# Patient Record
Sex: Female | Born: 1945 | Race: White | Hispanic: No | Marital: Married
Health system: Midwestern US, Academic
[De-identification: ages and names within clinical notes are randomized; demographics above are authoritative.]

---

## 2016-10-24 LAB — COMPREHENSIVE METABOLIC PANEL
Lab: 1
Lab: 139
Lab: 16 — ABNORMAL HIGH (ref 0–14)
Lab: 23
Lab: 69

## 2016-10-27 LAB — BASIC METABOLIC PANEL
Lab: 0.9
Lab: 106 — ABNORMAL HIGH (ref 83–110)
Lab: 113 — ABNORMAL HIGH (ref 83–110)
Lab: 13
Lab: 13
Lab: 143 — ABNORMAL LOW (ref 33.0–37.0)
Lab: 28

## 2016-12-26 ENCOUNTER — Encounter: Admit: 2016-12-26 | Discharge: 2016-12-26 | Payer: MEDICARE

## 2016-12-26 DIAGNOSIS — R931 Abnormal findings on diagnostic imaging of heart and coronary circulation: Principal | ICD-10-CM

## 2016-12-28 LAB — CBC
Lab: 45 — ABNORMAL LOW (ref 3.5–5.1)
Lab: 8.8
Lab: 94

## 2016-12-28 LAB — SED RATE: Lab: 39 — ABNORMAL HIGH (ref 0–30)

## 2017-01-09 ENCOUNTER — Encounter: Admit: 2017-01-09 | Discharge: 2017-01-09 | Payer: MEDICARE

## 2017-01-09 DIAGNOSIS — I10 Essential (primary) hypertension: ICD-10-CM

## 2017-01-09 DIAGNOSIS — J3489 Other specified disorders of nose and nasal sinuses: ICD-10-CM

## 2017-01-09 DIAGNOSIS — E119 Type 2 diabetes mellitus without complications: ICD-10-CM

## 2017-01-09 DIAGNOSIS — E785 Hyperlipidemia, unspecified: Principal | ICD-10-CM

## 2017-01-09 DIAGNOSIS — K219 Gastro-esophageal reflux disease without esophagitis: ICD-10-CM

## 2017-01-10 LAB — COMPREHENSIVE METABOLIC PANEL
Lab: 0.5
Lab: 15 — ABNORMAL HIGH (ref 0–14)
Lab: 17

## 2017-01-11 LAB — BASIC METABOLIC PANEL
Lab: 0.9
Lab: 126 — ABNORMAL HIGH (ref 83–110)
Lab: 15
Lab: 15 — ABNORMAL HIGH (ref 0–14)

## 2017-01-12 ENCOUNTER — Encounter: Admit: 2017-01-12 | Discharge: 2017-01-12 | Payer: MEDICARE

## 2017-01-12 DIAGNOSIS — I10 Essential (primary) hypertension: ICD-10-CM

## 2017-01-12 DIAGNOSIS — E785 Hyperlipidemia, unspecified: Principal | ICD-10-CM

## 2017-01-12 DIAGNOSIS — J3489 Other specified disorders of nose and nasal sinuses: ICD-10-CM

## 2017-01-12 DIAGNOSIS — E119 Type 2 diabetes mellitus without complications: ICD-10-CM

## 2017-01-12 DIAGNOSIS — K219 Gastro-esophageal reflux disease without esophagitis: ICD-10-CM

## 2017-01-25 ENCOUNTER — Ambulatory Visit: Admit: 2017-01-25 | Discharge: 2017-01-26 | Payer: MEDICARE

## 2017-01-25 ENCOUNTER — Encounter: Admit: 2017-01-25 | Discharge: 2017-01-25 | Payer: MEDICARE

## 2017-01-25 DIAGNOSIS — I25119 Atherosclerotic heart disease of native coronary artery with unspecified angina pectoris: ICD-10-CM

## 2017-01-25 DIAGNOSIS — E785 Hyperlipidemia, unspecified: Principal | ICD-10-CM

## 2017-01-25 DIAGNOSIS — K219 Gastro-esophageal reflux disease without esophagitis: ICD-10-CM

## 2017-01-25 DIAGNOSIS — J3489 Other specified disorders of nose and nasal sinuses: ICD-10-CM

## 2017-01-25 DIAGNOSIS — E119 Type 2 diabetes mellitus without complications: ICD-10-CM

## 2017-01-25 DIAGNOSIS — I10 Essential (primary) hypertension: ICD-10-CM

## 2017-01-25 MED ORDER — ROSUVASTATIN 20 MG PO TAB
20 mg | ORAL_TABLET | Freq: Every day | ORAL | 3 refills | 90.00000 days | Status: AC
Start: 2017-01-25 — End: 2017-02-06

## 2017-02-01 ENCOUNTER — Encounter: Admit: 2017-02-01 | Discharge: 2017-02-01 | Payer: MEDICARE

## 2017-02-02 ENCOUNTER — Ambulatory Visit: Admit: 2017-02-02 | Discharge: 2017-02-03 | Payer: MEDICARE

## 2017-02-02 DIAGNOSIS — I25119 Atherosclerotic heart disease of native coronary artery with unspecified angina pectoris: ICD-10-CM

## 2017-02-02 DIAGNOSIS — E785 Hyperlipidemia, unspecified: Principal | ICD-10-CM

## 2017-02-02 MED ORDER — AMINOPHYLLINE 500 MG/20 ML IV SOLN
50 mg | INTRAVENOUS | 0 refills | Status: AC | PRN
Start: 2017-02-02 — End: ?

## 2017-02-02 MED ORDER — ALBUTEROL SULFATE 90 MCG/ACTUATION IN HFAA
2 | RESPIRATORY_TRACT | 0 refills | Status: DC | PRN
Start: 2017-02-02 — End: 2017-02-07

## 2017-02-02 MED ORDER — REGADENOSON 0.4 MG/5 ML IV SYRG
.4 mg | Freq: Once | INTRAVENOUS | 0 refills | Status: CP
Start: 2017-02-02 — End: ?

## 2017-02-02 MED ORDER — NITROGLYCERIN 0.4 MG SL SUBL
.4 mg | SUBLINGUAL | 0 refills | Status: AC | PRN
Start: 2017-02-02 — End: ?

## 2017-02-02 MED ORDER — SODIUM CHLORIDE 0.9 % IV SOLP
250 mL | INTRAVENOUS | 0 refills | Status: AC | PRN
Start: 2017-02-02 — End: ?

## 2017-02-06 ENCOUNTER — Encounter: Admit: 2017-02-06 | Discharge: 2017-02-06 | Payer: MEDICARE

## 2017-02-06 MED ORDER — PRAVASTATIN 20 MG PO TAB
20 mg | ORAL_CAPSULE | Freq: Every evening | ORAL | 5 refills | 90.00000 days | Status: AC
Start: 2017-02-06 — End: 2017-05-03

## 2017-03-06 LAB — BASIC METABOLIC PANEL
Lab: 1 MMOL/L (ref 21–30)
Lab: 108 g/dL — ABNORMAL HIGH (ref 98–107)
Lab: 116 U/L — ABNORMAL HIGH (ref 83–110)
Lab: 15 U/L (ref 7–40)
Lab: 25 U/L — ABNORMAL LOW (ref 25–110)
Lab: 4.3 mg/dL (ref 0.3–1.2)
Lab: 53 mL/min — ABNORMAL LOW (ref >60)
Lab: 9.4 10*3/uL — ABNORMAL HIGH (ref 3–12)

## 2017-04-12 ENCOUNTER — Encounter: Admit: 2017-04-12 | Discharge: 2017-04-12 | Payer: MEDICARE

## 2017-04-12 ENCOUNTER — Ambulatory Visit: Admit: 2017-04-12 | Discharge: 2017-04-13 | Payer: MEDICARE

## 2017-04-12 DIAGNOSIS — K219 Gastro-esophageal reflux disease without esophagitis: ICD-10-CM

## 2017-04-12 DIAGNOSIS — R0600 Dyspnea, unspecified: Principal | ICD-10-CM

## 2017-04-12 DIAGNOSIS — E119 Type 2 diabetes mellitus without complications: ICD-10-CM

## 2017-04-12 DIAGNOSIS — J3489 Other specified disorders of nose and nasal sinuses: ICD-10-CM

## 2017-04-12 DIAGNOSIS — I10 Essential (primary) hypertension: Secondary | ICD-10-CM

## 2017-04-12 DIAGNOSIS — E785 Hyperlipidemia, unspecified: Principal | ICD-10-CM

## 2017-04-16 ENCOUNTER — Encounter: Admit: 2017-04-16 | Discharge: 2017-04-16 | Payer: MEDICARE

## 2017-04-16 DIAGNOSIS — R0600 Dyspnea, unspecified: Principal | ICD-10-CM

## 2017-04-19 ENCOUNTER — Encounter: Admit: 2017-04-19 | Discharge: 2017-04-19 | Payer: MEDICARE

## 2017-05-03 ENCOUNTER — Encounter: Admit: 2017-05-03 | Discharge: 2017-05-03 | Payer: MEDICARE

## 2017-05-03 MED ORDER — PRAVASTATIN 40 MG PO TAB
40 mg | ORAL_CAPSULE | Freq: Every evening | ORAL | 5 refills | 90.00000 days | Status: AC
Start: 2017-05-03 — End: 2017-10-29

## 2017-05-08 LAB — BASIC METABOLIC PANEL
Lab: 104
Lab: 113 — ABNORMAL HIGH (ref 83–110)
Lab: 141
Lab: 15
Lab: 3.8

## 2017-06-26 ENCOUNTER — Encounter: Admit: 2017-06-26 | Discharge: 2017-06-26 | Payer: MEDICARE

## 2017-06-26 DIAGNOSIS — E7849 Other hyperlipidemia: Principal | ICD-10-CM

## 2017-07-02 LAB — LIPID PROFILE
Lab: 113 10*3/uL — ABNORMAL LOW (ref 150–200)
Lab: 17 10*3/uL (ref 0–0.20)
Lab: 28 mL/min — ABNORMAL LOW (ref 35–60)
Lab: 4
Lab: 76 10*3/uL (ref 0–0.45)
Lab: 83 mL/min — ABNORMAL LOW (ref >60)

## 2017-07-13 ENCOUNTER — Encounter: Admit: 2017-07-13 | Discharge: 2017-07-13 | Payer: MEDICARE

## 2017-07-13 DIAGNOSIS — E7849 Other hyperlipidemia: Principal | ICD-10-CM

## 2017-07-20 ENCOUNTER — Encounter: Admit: 2017-07-20 | Discharge: 2017-07-20 | Payer: MEDICARE

## 2017-07-30 ENCOUNTER — Encounter: Admit: 2017-07-30 | Discharge: 2017-07-30 | Payer: MEDICARE

## 2017-07-30 DIAGNOSIS — E7849 Other hyperlipidemia: Principal | ICD-10-CM

## 2017-10-27 ENCOUNTER — Encounter: Admit: 2017-10-27 | Discharge: 2017-10-27 | Payer: MEDICARE

## 2017-10-29 MED ORDER — PRAVASTATIN 40 MG PO TAB
ORAL_TABLET | Freq: Every day | ORAL | 3 refills | 90.00000 days | Status: AC
Start: 2017-10-29 — End: 2018-05-21

## 2017-11-14 ENCOUNTER — Encounter: Admit: 2017-11-14 | Discharge: 2017-11-14 | Payer: MEDICARE

## 2017-11-20 ENCOUNTER — Encounter: Admit: 2017-11-20 | Discharge: 2017-11-20 | Payer: MEDICARE

## 2017-11-20 DIAGNOSIS — E7849 Other hyperlipidemia: Principal | ICD-10-CM

## 2017-11-20 LAB — LIPID PROFILE
Lab: 138 — ABNORMAL LOW (ref 150–200)
Lab: 3
Lab: 40
Lab: 87
Lab: 18
Lab: 92

## 2017-11-29 ENCOUNTER — Encounter: Admit: 2017-11-29 | Discharge: 2017-11-29 | Payer: MEDICARE

## 2017-11-29 ENCOUNTER — Ambulatory Visit: Admit: 2017-11-29 | Discharge: 2017-11-30 | Payer: MEDICARE

## 2017-11-29 DIAGNOSIS — E785 Hyperlipidemia, unspecified: Principal | ICD-10-CM

## 2017-11-29 DIAGNOSIS — R0600 Dyspnea, unspecified: Principal | ICD-10-CM

## 2017-11-29 DIAGNOSIS — I10 Essential (primary) hypertension: ICD-10-CM

## 2017-11-29 DIAGNOSIS — K219 Gastro-esophageal reflux disease without esophagitis: ICD-10-CM

## 2017-11-29 DIAGNOSIS — E119 Type 2 diabetes mellitus without complications: ICD-10-CM

## 2017-11-29 DIAGNOSIS — J3489 Other specified disorders of nose and nasal sinuses: ICD-10-CM

## 2017-11-30 ENCOUNTER — Encounter: Admit: 2017-11-30 | Discharge: 2017-11-30 | Payer: MEDICARE

## 2018-05-20 ENCOUNTER — Encounter: Admit: 2018-05-20 | Discharge: 2018-05-20 | Payer: MEDICARE

## 2018-05-20 NOTE — Telephone Encounter
Eulis Foster, APRN at [facility] called and left message requesting SBG review abnormal findings on CT chest done today.  Attempted to call Victorino Dike back, but had to leave a message. Called pt to review sx.  Patient reports she had an episode of increasing sob yesterday, with chest congestion and cough.  Also reports R upper chest pain.  Pt describes as discomfort, worse with coughing.  Plan to schedule telephone visit with SBG for review of abnormal CT chest that demonstrated extensive calcification of the coronary vessels.  Pt declined telehealth visit, but is okay with a phone visit. Pt is aware of date and time.   Copy of CT scan emailed to SBG.

## 2018-05-21 ENCOUNTER — Ambulatory Visit: Admit: 2018-05-21 | Discharge: 2018-05-22 | Payer: MEDICARE

## 2018-05-21 ENCOUNTER — Encounter: Admit: 2018-05-21 | Discharge: 2018-05-21 | Payer: MEDICARE

## 2018-05-21 DIAGNOSIS — J3489 Other specified disorders of nose and nasal sinuses: ICD-10-CM

## 2018-05-21 DIAGNOSIS — E119 Type 2 diabetes mellitus without complications: ICD-10-CM

## 2018-05-21 DIAGNOSIS — E785 Hyperlipidemia, unspecified: Principal | ICD-10-CM

## 2018-05-21 DIAGNOSIS — I10 Essential (primary) hypertension: Secondary | ICD-10-CM

## 2018-05-21 DIAGNOSIS — K219 Gastro-esophageal reflux disease without esophagitis: ICD-10-CM

## 2018-05-21 MED ORDER — PRAVASTATIN 40 MG PO TAB
40 mg | ORAL_TABLET | Freq: Two times a day (BID) | ORAL | 3 refills | 90.00000 days | Status: DC
Start: 2018-05-21 — End: 2018-05-23

## 2018-05-21 MED ORDER — ASPIRIN 81 MG PO TBEC
81 mg | ORAL_TABLET | Freq: Every day | ORAL | 3 refills | Status: AC
Start: 2018-05-21 — End: ?

## 2018-05-21 NOTE — Progress Notes
.Obtained patient's verbal consent to treat them and their agreement to [facility] financial policy and NPP via this telehealth visit during the [facility] Emergency  Date of Service: 05/21/2018    Haley [name] is a 73 y.o. female.       HPI    This clinician patient interaction was conducted over the telephone.  [name]???is followed for chronic dyspnea with exertion.??? She has been followed for chronic obstructive pulmonary disease.  Recently she developed a productive cough and dyspnea with wheezing which improved when she uses her inhaler. ???She has been placed on Cefdinir 300 mg twice daily for 7 days and prednisone 30 mg daily for 4 days.  A CT angio was obtained to evaluate for pulmonary emboli and revealed coronary calcification.  Evidently, according to the patient the results of the CT scan triggered the consultation today.  However, a similar situation occurred back in November 2018.  A CT scan was obtained which showed coronary calcification and then a stress study was done which was low risk for coronary events.  The patient reports that her dyspnea improves significantly after she uses her inhaler and she can walk for a mile without difficulty.  She reports no nocturnal dyspnea, dyspnea at rest, orthopnea or edema.  Her weight has been stable at 143 pounds.   Otherwise, the patient???indicates that she has been stable???and reports no angina, congestive symptoms, palpitations, sensation of sustained forceful heart pounding, lightheadedness or syncope.??????The patient reports no myalgias, bleeding abnormalities, neurologic motor abnormalities or difficulty with speech.??????She reports no claudication.??????When I saw her in December 2018 she wanted to try to change her statin therapy from pravastatin to rosuvastatin for a more potent effect. ???She stopped taking the rosuvastatin due to gastric burning and then restarted pravastatin. ???She is not sure that rosuvastatin actually 79 bpm.  Her weight was 143 pounds.  She reports no edema.    Cardiovascular Studies  A 12-lead ECG obtained on 04/12/2017 shows normal sinus rhythm with a heart rate of 62 bpm. ???Nondiagnostic ST-T wave abnormalities are seen.  ???  Labs from 08/20/2014 revealed total cholesterol 149, triglycerides 128, HDL 40 and LDL cholesterol 90 mg???per deciliter. ???Her ALT???was 29.  Labs from 10/24/2016 revealed serum creatinine 1.06 mg/dL. ???Her hemoglobin A1c on 10/19/2016 was 5.5%.  Labs from July 02, 2017 reveals total cholesterol 113, triglycerides 83, HDL 28 and LDL cholesterol 76 mg/dL.  Labs from 05/08/2017 revealed serum potassium 3.8 mmol/L and serum creatinine 1.16 mg/dL.  Labs from 12/31/2016 revealed ALT = 17.  Labs from November 20, 2017 revealed serum cholesterol 138, triglycerides 92, HDL 40 and LDL cholesterol 87 mg/dL.    An outside echocardiogram obtained on November 30, 2016 revealed: 1) normal left ventricular systolic function. ???2) mild aortic valve insufficiency. ???3) a small pericardial effusion. ???There appears to be an echodensity in the fluid adjacent to the right atrium. ???  Outside pulmonary function tests obtained on 11/24/2016 revealed FVC = 1.83, FEV1 1.55 and FEV1/FVC ratio equals 85%. ???Her residual volume was 2.96, total lung capacity 5.26 and residual volume to total lung capacity ratio 56%. ???Her DLCO uncorrected was 63% of predicted and her DL corrected for minute ventilation was 69% of predicted. The summary indicates that the patient has mildly diminished FEV1 at 71% of predicted. ???There was a trend to improvement postbronchodilator. ???Normal relative lung volumes. ???There is a moderately diminished diffusion.  Outside???CT thorax obtained on December 05, 2016 revealed: The heart appears normal in size. ???No significant  pericardial effusion. ???Large high attenuation foci within the proximal LAD. ???Moderate amount of calcified plaque within the aortic arch. ???Centrilobular emphysematous changes.

## 2018-05-22 DIAGNOSIS — E7849 Other hyperlipidemia: Principal | ICD-10-CM

## 2018-05-22 DIAGNOSIS — I10 Essential (primary) hypertension: ICD-10-CM

## 2018-05-23 ENCOUNTER — Encounter: Admit: 2018-05-23 | Discharge: 2018-05-23 | Payer: MEDICARE

## 2018-05-23 DIAGNOSIS — E7849 Other hyperlipidemia: Principal | ICD-10-CM

## 2018-05-23 DIAGNOSIS — I10 Essential (primary) hypertension: ICD-10-CM

## 2018-05-23 MED ORDER — PRAVASTATIN 80 MG PO TAB
ORAL_TABLET | Freq: Every day | ORAL | 1 refills | 90.00000 days | Status: DC
Start: 2018-05-23 — End: 2018-09-10

## 2018-05-24 ENCOUNTER — Encounter: Admit: 2018-05-24 | Discharge: 2018-05-24 | Payer: MEDICARE

## 2018-05-27 ENCOUNTER — Encounter: Admit: 2018-05-27 | Discharge: 2018-05-27 | Payer: MEDICARE

## 2018-05-27 ENCOUNTER — Ambulatory Visit: Admit: 2018-05-27 | Discharge: 2018-05-28 | Payer: MEDICARE

## 2018-05-27 ENCOUNTER — Ambulatory Visit: Admit: 2018-05-27 | Discharge: 2018-05-27 | Payer: MEDICARE

## 2018-05-27 DIAGNOSIS — I10 Essential (primary) hypertension: ICD-10-CM

## 2018-05-27 DIAGNOSIS — E7849 Other hyperlipidemia: Principal | ICD-10-CM

## 2018-05-27 MED ORDER — PERFLUTREN LIPID MICROSPHERES 1.1 MG/ML IV SUSP
1-20 mL | Freq: Once | INTRAVENOUS | 0 refills | Status: CP | PRN
Start: 2018-05-27 — End: ?

## 2018-05-27 NOTE — Telephone Encounter
-----   Message from Hester Mates, MD sent at 05/27/2018 12:36 PM CDT -----  Haley [name]'s echo Doppler study looks favorable.  Please let her know.  Thanks.  SBG  ----- Message -----  From: Glynda Jaeger, MD  Sent: 05/27/2018  10:10 AM CDT  To: Hester Mates, MD

## 2018-05-27 NOTE — Telephone Encounter
Left vm with Bonita Quin about her echo results per SBG and asked for her to call the nurse vm line with further questions.

## 2018-05-27 NOTE — Telephone Encounter
Left a message to call back for results

## 2018-05-28 ENCOUNTER — Encounter: Admit: 2018-05-28 | Discharge: 2018-05-28 | Payer: MEDICARE

## 2018-05-29 ENCOUNTER — Encounter: Admit: 2018-05-29 | Discharge: 2018-05-29 | Payer: MEDICARE

## 2018-06-10 ENCOUNTER — Encounter: Admit: 2018-06-10 | Discharge: 2018-06-10 | Payer: MEDICARE

## 2018-06-10 ENCOUNTER — Ambulatory Visit: Admit: 2018-06-10 | Discharge: 2018-06-11 | Payer: MEDICARE

## 2018-06-10 DIAGNOSIS — J3489 Other specified disorders of nose and nasal sinuses: ICD-10-CM

## 2018-06-10 DIAGNOSIS — E119 Type 2 diabetes mellitus without complications: ICD-10-CM

## 2018-06-10 DIAGNOSIS — E785 Hyperlipidemia, unspecified: Principal | ICD-10-CM

## 2018-06-10 DIAGNOSIS — K219 Gastro-esophageal reflux disease without esophagitis: ICD-10-CM

## 2018-06-10 DIAGNOSIS — I10 Essential (primary) hypertension: ICD-10-CM

## 2018-06-10 NOTE — Progress Notes
05/27/2018 ECHO DOPPLER:    ??? Left Ventricle: Normal size and wall thickness. Concentric remodeling. Normal ejection fraction with LVEF=65%. No segmental wall motion abnormalities.  ??? Right Ventricle: Normal size, wall thickness and ejection fraction.  ??? Normal biatrial size.  ??? There is no no significant valve disease.  ??? Estimated Peak Systolic PA Pressure 27 mmHg  ??? No pericardial effusion.  ??? No prior for comparison.     ??? DM2 (diabetes mellitus, type 2) (HCC) 10/30/2012     on metformin     ??? Sinus problem 10/30/2012   ??? HLD (hyperlipidemia) 10/30/2012   ??? GERD (gastroesophageal reflux disease) 10/30/2012   ??? Breast mass 10/25/2012     DIAGNOSIS:  Left breast mass on outside imaging     HISTORY:  [name] is a Caucasian female who presented to the [facility] on 10/30/2012 at age 73 for evaluation of a left breast mass on outside imaging.   BREAST IMAGING:  Mammogram:  Bilateral screening mammogram 06/12/12 [facility]) revealed a 6 mm mass in the lateral left breast. There was an oil cyst adjacent to the mass. No abnormalities in the right breast. 6 month follow up left mammogram was recommended. Left diagnostic mammogram 10/17/12 [facility]) revealed a 7 mm density in the lateral breast which had in size from May 2014. There was an oil cyst anterior to the lesion.  Ultrasound:  Left breast ultrasound 10/17/12 [facility]) revealed an oil cyst between 2-4:00. There was no correlation to mammogram finding.    REPRODUCTIVE HEALTH:  Age at first Menarche:  77  Age at First Live Birth: 64   Age at Menopause:  unknown, had hysterectomy, still has ovaries, unsure what year  Gravida:  3  Para: 3  Breastfeeding:  N/A    PERTINENT PMH:  Sinus trouble, DM2, hyperlipidemia, GERD, HTN  FAMILY HISTORY:  No family history of breast, ovarian, prostate or pancreatic cancer  PHYSICAL EXAM on PRESENTATION:  Right - No palpable breast masses. No skin, nipple, or areolar change. Left - No palpable breast masses. No Inhale 2.5 mg solution by nebulizer as directed daily.   ??? amLODIPine (NORVASC) 10 mg tablet Take 10 mg by mouth daily.   ??? aspirin EC 81 mg tablet Take one tablet by mouth daily. Take with food.   ??? budesonide respule(+) (PULMICORT) 1 mg/2 mL nbsp nebulizer solution Inhale 1 mg solution by nebulizer as directed twice daily.   ??? escitalopram oxalate (LEXAPRO) 10 mg tablet Take 20 mg by mouth daily.   ??? famotidine (PEPCID) 20 mg tablet Take 40 mg by mouth at bedtime daily.   ??? fexofenadine (ALLEGRA ALLERGY) 180 mg tablet Take 180 mg by mouth daily.   ??? fluticasone (FLONASE) 50 mcg/actuation nasal spray Apply 2 sprays to each nostril as directed daily.   ??? Formoterol Fumarate (PERFOROMIST) 20 mcg/2 mL nebu Inhale 20 mcg by mouth into the lungs twice daily.   ??? losartan(+) (COZAAR) 100 mg tablet Take 100 mg by mouth daily.   ??? omeprazole DR (PRILOSEC) 40 mg capsule Take 40 mg by mouth daily before breakfast.   ??? POTASSIUM CHLORIDE (KLOR-CON M20 PO) Take 1 tablet by mouth daily. alternating days of 1 and 2 tab. m,w, f 1 tab.  tues, thurs, Sunday, 2 tab   ??? pravastatin (PRAVACHOL) 80 mg tablet TAKE 1 TABLET BY MOUTH ONCE DAILY   ??? solifenacin(+) (VESICARE) 10 mg tablet Take 10 mg by mouth daily.   ??? spironolactone (ALDACTONE) 25 mg  tablet Take 1 tablet by mouth as Needed.   ??? sucralfate (CARAFATE) 1 gram tablet Take 1 g by mouth as Needed. Take on an empty stomach.    ??? zolpidem (AMBIEN) 10 mg tablet Take 10 mg by mouth as Needed.

## 2018-06-11 DIAGNOSIS — E7849 Other hyperlipidemia: Principal | ICD-10-CM

## 2018-07-29 ENCOUNTER — Encounter: Admit: 2018-07-29 | Discharge: 2018-07-29

## 2018-08-12 ENCOUNTER — Encounter: Admit: 2018-08-12 | Discharge: 2018-08-12

## 2018-08-12 DIAGNOSIS — E7849 Other hyperlipidemia: Secondary | ICD-10-CM

## 2018-08-12 LAB — LIPID PROFILE
Lab: 144
Lab: 19
Lab: 3
Lab: 43
Lab: 82
Lab: 93

## 2018-08-12 LAB — ALT (SGPT): Lab: 17

## 2018-09-10 ENCOUNTER — Encounter: Admit: 2018-09-10 | Discharge: 2018-09-10

## 2018-09-10 ENCOUNTER — Ambulatory Visit: Admit: 2018-09-10 | Discharge: 2018-09-11

## 2018-09-10 DIAGNOSIS — I10 Essential (primary) hypertension: Secondary | ICD-10-CM

## 2018-09-10 DIAGNOSIS — J3489 Other specified disorders of nose and nasal sinuses: Secondary | ICD-10-CM

## 2018-09-10 DIAGNOSIS — K219 Gastro-esophageal reflux disease without esophagitis: Secondary | ICD-10-CM

## 2018-09-10 DIAGNOSIS — E119 Type 2 diabetes mellitus without complications: Secondary | ICD-10-CM

## 2018-09-10 DIAGNOSIS — I2510 Atherosclerotic heart disease of native coronary artery without angina pectoris: Secondary | ICD-10-CM

## 2018-09-10 DIAGNOSIS — E785 Hyperlipidemia, unspecified: Secondary | ICD-10-CM

## 2018-09-10 DIAGNOSIS — E7800 Pure hypercholesterolemia, unspecified: Secondary | ICD-10-CM

## 2018-09-10 MED ORDER — PRAVASTATIN 40 MG PO TAB
40 mg | ORAL_TABLET | Freq: Two times a day (BID) | ORAL | 3 refills | 90.00000 days | Status: DC
Start: 2018-09-10 — End: 2019-03-20

## 2018-09-10 NOTE — Progress Notes
Date of Service: 09/10/2018    Haley [name] is a 73 y.o. female.       HPI     HPI    [name]???is followed for chronic dyspnea with exertion.??? She has been followed for chronic obstructive pulmonary disease.    In April 2020 she developed an upper respiratory tract infection which improved with antibiotics and prednisone. A CT angio was obtained to evaluate for pulmonary emboli and revealed coronary calcification. However, a similar situation occurred back in November 2018.  A CT scan was obtained which showed coronary calcification and then a stress study was done which was low risk for coronary events.  The patient reports that her dyspnea improves significantly after she uses her inhaler and she can walk for a mile without difficulty.  She reports no nocturnal dyspnea, dyspnea at rest, orthopnea or edema.  Her weight has been stable.  Otherwise, the patient???indicates that she has been stable???and reports no angina, congestive symptoms, palpitations, sensation of sustained forceful heart pounding, lightheadedness or syncope.??????The patient reports no myalgias, bleeding abnormalities, neurologic motor abnormalities or difficulty with speech.??????She reports no claudication.??? When I spoke with her in April 2020 she wanted to increase her pravastatin to 40 mg twice a day.  Taking pravastatin 40 mg twice a day causes no gastric upset whereas taking 80 mg once a day causes occasional mild gastric upset.  Historically, [name] has previously smoked cigarettes and smoked for 15-20 years,???averaging three quarters of a pack per day. ???She stopped smoking in 2017. ???Ms.???Jasiah Houy has recurrent episodes of bronchitis, and will occasionally have several episodes a year.  The???patient reports that she underwent colonoscopy in the spring 2019 and that 10 polyps were found but no cancer. ???She also underwent left hernia repair in April 2019 without complications. [name] reports no prior history of coronary artery disease, myocardial infarction, congestive heart failure, transient ischemic attack, stroke, or symptomatic peripheral arterial disease. ???She did obtain an echo Doppler study on November 30, 2016 because of nocturnal hypoxia. ???This suggested???an echodensity with a fluid collection adjacent to the right atrium and led to a CT scan scan of the thorax. ???The CT scan of the thorax showed a moderate amount of calcified plaque within the aortic arch as well as focal calcified plaque within the proximal left anterior descending coronary artery.  This led to obtaining a pharmacologic stress test which was low risk for coronary events.       Vitals:    09/10/18 1053 09/10/18 1108   BP: 118/70 122/72   BP Source: Arm, Left Upper Arm, Right Upper   Pulse: 68    SpO2: 98%    Weight: 66.7 kg (147 lb)    Height: 1.6 m (5' 3)    PainSc: Zero      Body mass index is 26.04 kg/m???.     Past Medical History  Patient Active Problem List    Diagnosis Date Noted   ??? Essential hypertension 01/12/2017     05/27/2018 ECHO DOPPLER:    ??? Left Ventricle: Normal size and wall thickness. Concentric remodeling. Normal ejection fraction with LVEF=65%. No segmental wall motion abnormalities.  ??? Right Ventricle: Normal size, wall thickness and ejection fraction.  ??? Normal biatrial size.  ??? There is no no significant valve disease.  ??? Estimated Peak Systolic PA Pressure 27 mmHg  ??? No pericardial effusion.  ??? No prior for comparison.     ??? DM2 (diabetes mellitus, type 2) (HCC) 10/30/2012  on metformin     ??? Sinus problem 10/30/2012   ??? HLD (hyperlipidemia) 10/30/2012   ??? GERD (gastroesophageal reflux disease) 10/30/2012   ??? Breast mass 10/25/2012     DIAGNOSIS:  Left breast mass on outside imaging     HISTORY:  [name] is a Caucasian female who presented to the [facility] on 10/30/2012 at age 40 for evaluation of a left breast mass on outside imaging.   BREAST IMAGING: Mammogram:  Bilateral screening mammogram 06/12/12 [facility]) revealed a 6 mm mass in the lateral left breast. There was an oil cyst adjacent to the mass. No abnormalities in the right breast. 6 month follow up left mammogram was recommended. Left diagnostic mammogram 10/17/12 [facility]) revealed a 7 mm density in the lateral breast which had in size from May 2014. There was an oil cyst anterior to the lesion.  Ultrasound:  Left breast ultrasound 10/17/12 [facility]) revealed an oil cyst between 2-4:00. There was no correlation to mammogram finding.    REPRODUCTIVE HEALTH:  Age at first Menarche:  27  Age at First Live Birth: 28   Age at Menopause:  unknown, had hysterectomy, still has ovaries, unsure what year  Gravida:  3  Para: 3  Breastfeeding:  N/A    PERTINENT PMH:  Sinus trouble, DM2, hyperlipidemia, GERD, HTN  FAMILY HISTORY:  No family history of breast, ovarian, prostate or pancreatic cancer  PHYSICAL EXAM on PRESENTATION:  Right - No palpable breast masses. No skin, nipple, or areolar change. Left - No palpable breast masses. No skin, nipple, or areolar change. No supraclavicular or axillary adenopathy.  REFERRED BY:  Dr. Steva Ready             Review of Systems   Constitution: Negative.   HENT: Negative.    Eyes: Negative.    Cardiovascular: Negative.    Respiratory: Negative.    Endocrine: Negative.    Hematologic/Lymphatic: Negative.    Skin: Negative.    Musculoskeletal: Negative.    Gastrointestinal: Negative.    Genitourinary: Negative.    Neurological: Negative.    Psychiatric/Behavioral: Negative.    Allergic/Immunologic: Negative.        Physical Exam  GENERAL: The patient is well developed, well nourished, resting comfortably and in no distress.   HEENT: No abnormalities of the visible oro-nasopharynx, conjunctiva or sclera are noted.  NECK: There is no jugular venous distension. Carotids are palpable and without bruits. There is no thyroid enlargement. Chest: Lung fields are clear to auscultation. There are no wheezes or crackles.  CV: There is a regular rhythm. The first and second heart sounds are normal. There are no murmurs, gallops or rubs.??????Her apical heart rate is 68 bpm.  ABD: The abdomen is soft and supple with normal bowel sounds. There is no hepatosplenomegaly, ascites, tenderness, masses or bruits.  Neuro: There are no focal motor defects. Ambulation is normal. Cognitive function appears normal.  Ext:???Trace bipedal???edema is noted without???evidence of deep vein thrombosis. Peripheral pulses are satisfactory. ???  SKIN:???There are no rashes and no cellulitis  PSYCH:???The patient is calm, rationale and oriented.    Cardiovascular Studies  A twelve-lead ECG obtained on May 27, 2018 shows normal sinus rhythm with a heart rate of 74 bpm.  Mild nondiagnostic ST-T wave abnormalities are seen without appreciable change when compared with a prior ECG obtained on April 12, 2017.    Labs obtained on 08/12/2018 on pravastatin 40 mg twice daily reveals total cholesterol 144, triglycerides 93, HDL  43 and LDL cholesterol 82 mg/dL.  Her ALT = 17.  Labs obtained on 11/20/2017 on pravastatin 40 mg daily revealed total cholesterol 138, triglycerides 92, HDL 40 and LDL cholesterol 87 mg/dL.  Echo Doppler 05/27/2018:    ??? Left Ventricle: Normal size and wall thickness. Concentric remodeling. Normal ejection fraction with LVEF=65%. No segmental wall motion abnormalities.  ??? Right Ventricle: Normal size, wall thickness and ejection fraction.  ??? Normal biatrial size.  ??? There is no no significant valve disease.  ??? Estimated Peak Systolic PA Pressure 27 mmHg  ??? No pericardial effusion.  ??? No prior for comparison.    Regadenoson thallium stress test 05/27/2018:  Scintigraphic (planar/tomographic):   There are no perfusion defects.  All myocardial segments appear viable. Polar coordinate map identifies no perfusion abnormalities. TID Ratio:  1.11  (normal <1.36). Summed Stress Score:  0   , Summed Rest Score:  0. Regional Wall Thickening and Motion Post Stress:  ???There is normal left ventricular wall motion and thickening of all myocardial segments. Left Ventricular Ejection Fraction (post stress, in the resting state) =??? 83 %.  Left Ventricular End Diastolic Volume: 36 mL  SUMMARY/OPINION:??????This study is normal with no evidence of significant myocardial ischemia.  The overall calculated left ventricular systolic function is normal.  There is normal regional wall motion and thickening in all segments.  There are no high risk prognostic indicators present.  The pharmacologic ECG portion of the study is negative for ischemia. Comparison was made to a prior study performed on February 02, 2017, also on the ADAC camera.  That study demonstrated a small fixed apical defect which was felt to be secondary to soft tissue attenuation.  The calculated EF was 64% with an end-diastolic volume of 44 mL.  Comparing the 2 studies qualitatively, there has been no significant interval changes. In aggregate the current study is low risk in regards to predicted annual cardiovascular mortality rate.    Problems Addressed Today  Asymptomatic coronary artery disease.  Hypertension.  Hypercholesterolemia.  Assessment and Plan     Ms. Daanya Lanphier has evidence of coronary disease by CT scanning but no objective evidence apparent on recent regadenoson thallium stress testing.  She reports no angina or congestive symptoms.  Her blood pressure and lipid profile appear well controlled.  Hopefully statin therapy will limit progression of her coronary disease.  She wants to continue pravastatin and has not tolerated more potent statin therapy with rosuvastatin in the past. Ms. Octavia Bruckner appears stable from a cardiovascular perspective. Regular mild aerobic exercise and adherence to a heart healthy diet were recommended. I have asked the patient to keep a log book of her BP readings and to report systolic BP readings exceeding 130 mm Hg. I have asked her to return for follow-up in 6 months.         Current Medications (including today's revisions)  ??? acetaminophen (TYLENOL) 500 mg tablet Take 500 mg by mouth as Needed for Pain. Max of 4,000 mg of acetaminophen in 24 hours.   ??? albuterol (PROAIR HFA, VENTOLIN HFA, OR PROVENTIL HFA) 90 mcg/actuation inhaler Inhale 2 puffs by mouth into the lungs every 4-6 hours as needed for Wheezing or Shortness of Breath. Shake well before use.   ??? amLODIPine (NORVASC) 10 mg tablet Take 10 mg by mouth daily.   ??? aspirin EC 81 mg tablet Take one tablet by mouth daily. Take with food.   ??? escitalopram oxalate (LEXAPRO) 10 mg tablet Take 20 mg  by mouth daily.   ??? famotidine (PEPCID) 20 mg tablet Take 40 mg by mouth at bedtime daily.   ??? fexofenadine (ALLEGRA ALLERGY) 180 mg tablet Take 180 mg by mouth daily.   ??? fluticasone (FLONASE) 50 mcg/actuation nasal spray Apply 2 sprays to each nostril as directed daily.   ??? losartan(+) (COZAAR) 100 mg tablet Take 100 mg by mouth daily.   ??? omeprazole DR (PRILOSEC) 40 mg capsule Take 40 mg by mouth daily before breakfast.   ??? POTASSIUM CHLORIDE (KLOR-CON M20 PO) Take 1 tablet by mouth daily. alternating days of 1 and 2 tab. m,w, f 1 tab.  tues, thurs, Sunday, 2 tab   ??? pravastatin (PRAVACHOL) 40 mg tablet Take one tablet by mouth twice daily.   ??? solifenacin(+) (VESICARE) 10 mg tablet Take 10 mg by mouth daily.   ??? spironolactone (ALDACTONE) 25 mg tablet Take 1 tablet by mouth as Needed.   ??? sucralfate (CARAFATE) 1 gram tablet Take 1 g by mouth as Needed. Take on an empty stomach.    ??? SYMBICORT 160-4.5 mcg/actuation inhalation INHALE 2 PUFFS BY MOUTH TWICE DAILY IN THE MORNING AND IN THE EVENING RINSE MOUTH AND THROAT AFTER USE   ??? zolpidem (AMBIEN) 10 mg tablet Take 10 mg by mouth as Needed.

## 2019-03-20 ENCOUNTER — Encounter: Admit: 2019-03-20 | Discharge: 2019-03-20 | Payer: MEDICARE

## 2019-03-20 DIAGNOSIS — E119 Type 2 diabetes mellitus without complications: Secondary | ICD-10-CM

## 2019-03-20 DIAGNOSIS — J3489 Other specified disorders of nose and nasal sinuses: Secondary | ICD-10-CM

## 2019-03-20 DIAGNOSIS — E785 Hyperlipidemia, unspecified: Secondary | ICD-10-CM

## 2019-03-20 DIAGNOSIS — K219 Gastro-esophageal reflux disease without esophagitis: Secondary | ICD-10-CM

## 2019-03-20 DIAGNOSIS — I10 Essential (primary) hypertension: Secondary | ICD-10-CM

## 2019-03-20 MED ORDER — SPIRONOLACTONE 25 MG PO TAB
12.5 mg | ORAL_TABLET | Freq: Every day | ORAL | 3 refills | 90.00000 days | Status: AC
Start: 2019-03-20 — End: ?

## 2019-03-20 MED ORDER — PRAVASTATIN 80 MG PO TAB
80 mg | ORAL_TABLET | Freq: Every evening | ORAL | 11 refills | 90.00000 days | Status: DC
Start: 2019-03-20 — End: 2019-04-16

## 2019-03-21 ENCOUNTER — Encounter: Admit: 2019-03-21 | Discharge: 2019-03-21 | Payer: MEDICARE

## 2019-03-21 DIAGNOSIS — I10 Essential (primary) hypertension: Secondary | ICD-10-CM

## 2019-03-21 NOTE — Telephone Encounter
-----   Message from Hester Mates, MD sent at 03/21/2019 10:02 AM CST -----  To all:[name]'s potassium and renal function appear satisfactory.  Okay for her to start spironolactone 12.5 mg daily as discussed.  Please let her know.  Thanks.  SBG  ----- Message -----  From: Lauralee Evener, RN  Sent: 03/21/2019   8:44 AM CST  To: Hester Mates, MD    Lab results for your review and recommendations.

## 2019-03-21 NOTE — Telephone Encounter
Patient notified of results and recommendations per Dr. Arna Medici.

## 2019-04-16 ENCOUNTER — Encounter: Admit: 2019-04-16 | Discharge: 2019-04-16 | Payer: MEDICARE

## 2019-04-16 MED ORDER — PRAVASTATIN 80 MG PO TAB
80 mg | ORAL_TABLET | Freq: Every evening | ORAL | 1 refills | 90.00000 days | Status: AC
Start: 2019-04-16 — End: ?

## 2019-08-06 ENCOUNTER — Encounter: Admit: 2019-08-06 | Discharge: 2019-08-06 | Payer: MEDICARE

## 2019-08-12 ENCOUNTER — Encounter: Admit: 2019-08-12 | Discharge: 2019-08-12 | Payer: MEDICARE

## 2019-08-12 DIAGNOSIS — I10 Essential (primary) hypertension: Secondary | ICD-10-CM

## 2019-08-12 DIAGNOSIS — K219 Gastro-esophageal reflux disease without esophagitis: Secondary | ICD-10-CM

## 2019-08-12 DIAGNOSIS — J3489 Other specified disorders of nose and nasal sinuses: Secondary | ICD-10-CM

## 2019-08-12 DIAGNOSIS — E119 Type 2 diabetes mellitus without complications: Secondary | ICD-10-CM

## 2019-08-12 DIAGNOSIS — E785 Hyperlipidemia, unspecified: Secondary | ICD-10-CM

## 2019-08-12 NOTE — Progress Notes
Date of Service: 08/12/2019    Haley [name] is a 74 y.o. female.       HPI     Haley [name]?is followed for chronic dyspnea with exertion.??She has been followed for chronic obstructive pulmonary disease, hypertension and evidently heart failure with preserved ejection fraction.  She was hospitalized for 3 days in March 2021 with diverticulitis and received antibiotics.  Afterwards she developed C. difficile required treatment with vancomycin.  She was treated for urinary tract infection with Cipro in June 2021.  She has received both of her Covid vaccines.  Her blood pressure was elevated when I saw her in February 2021 and she was started on spironolactone 12.5 mg daily.  This has been very effective in controlling her blood pressure.  Her blood pressure now is routinely below 130/80 mmHg. Otherwise, the patient?indicates that she has been stable?and reports no angina, congestive symptoms, palpitations, sensation of sustained forceful heart pounding, lightheadedness or syncope.? She has been walking her dog for 20 minutes a day for exercise. ?The patient reports no myalgias, bleeding abnormalities, or strokelike symptoms.??She reports no claudication.??  Taking pravastatin 80 mg daily is preferable to 40 mg twice daily for her because it is more affordable.  Historically,?Haley [name] has previously smoked cigarettes and smoked for 15-20 years,?averaging three quarters of a pack per day. ?She stopped smoking in 2017. ?Ms.?Haley [name]?has recurrent episodes of bronchitis, and will occasionally have several episodes a year. ?The?patient reports that she underwent colonoscopy in the spring 2019?and?that 10 polyps were found but no cancer. ?She also underwent left hernia repair in April 2019 without complications. Haley [name] reports no prior history of coronary artery disease, myocardial infarction, congestive heart failure, transient ischemic attack, stroke, or symptomatic peripheral arterial disease. ?She did obtain an echo Doppler study on November 30, 2016 because of nocturnal hypoxia. ?This suggested?an echodensity with?a fluid collection adjacent to the right atrium and led to a CT scan scan of the thorax. ?The CT scan of the thorax showed a moderate amount of calcified plaque within the aortic arch as well as focal calcified plaque within the proximal left anterior descending coronary artery. ?This led to obtaining a pharmacologic stress test which was low risk for coronary events.In April 2020 she developed an?upper respiratory tract infection which improved with antibiotics and prednisone.?         Vitals:    08/12/19 1003   BP: 130/72   BP Source: Arm, Left Upper   Patient Position: Sitting   Pulse: 80   SpO2: 97%   Weight: 63.8 kg (140 lb 9.6 oz)   Height: 1.6 m (5' 3)   PainSc: Zero     Body mass index is 24.91 kg/m?Marland Kitchen     Past Medical History  Patient Active Problem List    Diagnosis Date Noted   ? Essential hypertension 01/12/2017     05/27/2018 ECHO DOPPLER:    ? Left Ventricle: Normal size and wall thickness. Concentric remodeling. Normal ejection fraction with LVEF=65%. No segmental wall motion abnormalities.  ? Right Ventricle: Normal size, wall thickness and ejection fraction.  ? Normal biatrial size.  ? There is no no significant valve disease.  ? Estimated Peak Systolic PA Pressure 27 mmHg  ? No pericardial effusion.  ? No prior for comparison.     ? DM2 (diabetes mellitus, type 2) (HCC) 10/30/2012     on metformin     ? Sinus problem 10/30/2012   ? HLD (hyperlipidemia) 10/30/2012   ? GERD (  gastroesophageal reflux disease) 10/30/2012   ? Breast mass 10/25/2012     DIAGNOSIS:  Left breast mass on outside imaging     HISTORY:  Haley [name] is a Caucasian female who presented to the [facility] on 10/30/2012 at age 4 for evaluation of a left breast mass on outside imaging.   BREAST IMAGING:  Mammogram:  Bilateral screening mammogram 06/12/12 [facility]) revealed a 6 mm mass in the lateral left breast. There was an oil cyst adjacent to the mass. No abnormalities in the right breast. 6 month follow up left mammogram was recommended. Left diagnostic mammogram 10/17/12 [facility]) revealed a 7 mm density in the lateral breast which had in size from May 2014. There was an oil cyst anterior to the lesion.  Ultrasound:  Left breast ultrasound 10/17/12 [facility]) revealed an oil cyst between 2-4:00. There was no correlation to mammogram finding.    REPRODUCTIVE HEALTH:  Age at first Menarche:  34  Age at First Live Birth: 22   Age at Menopause:  unknown, had hysterectomy, still has ovaries, unsure what year  Gravida:  3  Para: 3  Breastfeeding:  N/A    PERTINENT PMH:  Sinus trouble, DM2, hyperlipidemia, GERD, HTN  FAMILY HISTORY:  No family history of breast, ovarian, prostate or pancreatic cancer  PHYSICAL EXAM on PRESENTATION:  Right - No palpable breast masses. No skin, nipple, or areolar change. Left - No palpable breast masses. No skin, nipple, or areolar change. No supraclavicular or axillary adenopathy.  REFERRED BY:  Dr. Steva Ready             Review of Systems   Constitution: Negative.   HENT: Negative.    Eyes: Negative.    Cardiovascular: Negative.    Respiratory: Negative.    Endocrine: Negative.    Hematologic/Lymphatic: Negative.    Skin: Negative.    Musculoskeletal: Negative.    Gastrointestinal: Negative.    Genitourinary: Negative.    Neurological: Negative.    Psychiatric/Behavioral: Negative.    Allergic/Immunologic: Negative.        Physical Exam  GENERAL: The patient is well developed, well nourished, resting comfortably and in no distress.   HEENT: No abnormalities of the visible oro-nasopharynx, conjunctiva or sclera are noted.  NECK: There is no jugular venous distension. Carotids are palpable and without bruits. There is no thyroid enlargement.  Chest: Lung fields are clear to auscultation. There are no wheezes or crackles.  CV: There is a regular rhythm. The first and second heart sounds are normal. There are no murmurs, gallops or rubs.??Her apical heart rate is?76?bpm.  ABD: The abdomen is soft and supple with normal bowel sounds. There is no hepatosplenomegaly, ascites, tenderness, masses or bruits.  Neuro: There are no focal motor defects. Ambulation is normal. Cognitive function appears normal.  Ext:?Trace bipedal?edema is noted without?evidence of deep vein thrombosis. Peripheral pulses are satisfactory. ?  SKIN:?There are no rashes and no cellulitis  PSYCH:?The patient is calm, rationale and oriented.    Cardiovascular Studies   A twelve-lead ECG was obtained on 03/20/2019 reveals normal sinus rhythm with a heart rate of 72 bpm.  There is no evidence of myocardial ischemia or infarction.  Labs from 07/03/2019 revealed hemoglobin of 15.2 g%, potassium 3.5 mmol/L and serum creatinine 1.15 mg/dL.Labs obtained on 08/12/2018 on pravastatin 40 mg twice daily reveals total cholesterol 144, triglycerides 93, HDL 43 and LDL cholesterol 82 mg/dL. ?Her ALT = 17.  Labs obtained on 11/20/2017 on pravastatin 40 mg  daily revealed total cholesterol 138, triglycerides 92, HDL 40 and LDL cholesterol 87 mg/dL. ?    Echo Doppler 05/27/2018:?  ? Left Ventricle: Normal size and wall thickness. Concentric remodeling. Normal ejection fraction with LVEF=65%. No segmental wall motion abnormalities.  ? Right Ventricle: Normal size, wall thickness and ejection fraction.  ? Normal biatrial size.  ? There is no no significant valve disease.  ? Estimated Peak Systolic PA Pressure 27 mmHg  ? No pericardial effusion.  ? No prior for comparison.  ?  Regadenoson thallium stress test 05/27/2018:  Scintigraphic (planar/tomographic):???There are no perfusion defects. ?All myocardial segments appear viable.?Polar coordinate map identifies no perfusion abnormalities. TID Ratio: ?1.11 ?(normal <1.36). Summed Stress Score: ?0 ??, Summed Rest Score: ?0.?Regional Wall Thickening and Motion Post Stress: ??There is normal left ventricular wall motion and thickening of all myocardial segments. Left Ventricular Ejection Fraction (post stress, in the resting state) =??83 %. ?Left Ventricular End Diastolic Volume: 36 mL  SUMMARY/OPINION:??This study is normal with no evidence of significant myocardial ischemia. ?The overall calculated left ventricular systolic function is normal. ?There is normal regional wall motion and thickening in all segments. ?There are no high risk prognostic indicators present. ?The pharmacologic ECG portion of the study is negative for ischemia. Comparison was made to a prior study performed on February 02, 2017, also on the ADAC camera. ?That study demonstrated a small fixed apical defect which was felt to be secondary to soft tissue attenuation. ?The calculated EF was 64% with an end-diastolic volume of 44 mL. ?Comparing the 2 studies qualitatively, there has been no significant interval changes. In aggregate the current study is low risk in regards to predicted annual cardiovascular mortality rate.    Problems Addressed Today  Encounter Diagnoses   Name Primary?   ? Essential hypertension        Assessment and Plan     Haley [name]?appears stable from a cardiovascular perspective.  She reports no angina or congestive symptoms and her blood pressure currently is well controlled.  Regular?mild?aerobic exercise and adherence to a heart healthy diet were recommended.?I have asked the patient to keep a log book of her?BP readings and to report BP readings exceeding 130/80 mm Hg.?I have asked her to return for follow-up in?6?months.  ?         Current Medications (including today's revisions)  ? acetaminophen (TYLENOL) 500 mg tablet Take 500 mg by mouth as Needed for Pain. Max of 4,000 mg of acetaminophen in 24 hours.   ? albuterol (PROAIR HFA, VENTOLIN HFA, OR PROVENTIL HFA) 90 mcg/actuation inhaler Inhale 2 puffs by mouth into the lungs every 4-6 hours as needed for Wheezing or Shortness of Breath. Shake well before use.   ? amLODIPine (NORVASC) 10 mg tablet Take 10 mg by mouth daily.   ? aspirin EC 81 mg tablet Take one tablet by mouth daily. Take with food.   ? escitalopram oxalate (LEXAPRO) 10 mg tablet Take 20 mg by mouth daily.   ? famotidine (PEPCID) 20 mg tablet Take 20 mg by mouth twice daily.   ? fexofenadine (ALLEGRA ALLERGY) 180 mg tablet Take 180 mg by mouth daily.   ? fluticasone (FLONASE) 50 mcg/actuation nasal spray Apply 2 sprays to each nostril as directed daily.   ? losartan(+) (COZAAR) 100 mg tablet Take 100 mg by mouth daily.   ? metoclopramide (REGLAN) 10 mg tablet Take 10 mg by mouth before meals and at bedtime.   ? omeprazole DR (PRILOSEC) 40 mg capsule  Take 40 mg by mouth daily before breakfast.   ? pravastatin (PRAVACHOL) 80 mg tablet Take one tablet by mouth at bedtime daily.   ? sennosides/docusate sodium (SENNA PLUS PO) Take  by mouth.   ? solifenacin(+) (VESICARE) 10 mg tablet Take 10 mg by mouth daily.   ? spironolactone (ALDACTONE) 25 mg tablet Take one-half tablet by mouth daily.   ? sucralfate (CARAFATE) 1 gram tablet Take 1 g by mouth as Needed. Take on an empty stomach.    ? SYMBICORT 160-4.5 mcg/actuation inhalation INHALE 2 PUFFS BY MOUTH TWICE DAILY IN THE MORNING AND IN THE EVENING RINSE MOUTH AND THROAT AFTER USE   ? torsemide (DEMADEX) 20 mg tablet Take 20 mg by mouth as Needed.   ? zolpidem (AMBIEN) 10 mg tablet Take 10 mg by mouth as Needed.

## 2019-11-10 ENCOUNTER — Encounter: Admit: 2019-11-10 | Discharge: 2019-11-10 | Payer: MEDICARE

## 2019-12-23 ENCOUNTER — Encounter: Admit: 2019-12-23 | Discharge: 2019-12-23 | Payer: MEDICARE

## 2019-12-23 MED ORDER — PRAVASTATIN 80 MG PO TAB
ORAL_TABLET | Freq: Every day | ORAL | 3 refills | 90.00000 days | Status: AC
Start: 2019-12-23 — End: ?

## 2020-05-18 ENCOUNTER — Encounter: Admit: 2020-05-18 | Discharge: 2020-05-18 | Payer: MEDICARE

## 2020-05-20 ENCOUNTER — Encounter: Admit: 2020-05-20 | Discharge: 2020-05-20 | Payer: MEDICARE

## 2020-05-20 DIAGNOSIS — E7849 Other hyperlipidemia: Secondary | ICD-10-CM

## 2020-05-20 DIAGNOSIS — I10 Essential (primary) hypertension: Secondary | ICD-10-CM

## 2020-05-20 DIAGNOSIS — J3489 Other specified disorders of nose and nasal sinuses: Secondary | ICD-10-CM

## 2020-05-20 DIAGNOSIS — E119 Type 2 diabetes mellitus without complications: Secondary | ICD-10-CM

## 2020-05-20 DIAGNOSIS — E785 Hyperlipidemia, unspecified: Secondary | ICD-10-CM

## 2020-05-20 DIAGNOSIS — K219 Gastro-esophageal reflux disease without esophagitis: Secondary | ICD-10-CM

## 2020-05-20 NOTE — Progress Notes
Date of Service: 05/20/2020    Haley [name] is a 75 y.o. female.       HPI     Haley [name]?is followed for chronic dyspnea with exertion.??She has been followed for chronic obstructive pulmonary disease,?hypertension and evidently heart failure with preserved ejection fraction. She has received both of her Covid vaccines plus one booster.  Her blood pressures been well controlled in recent months.  Rarely she notices a right parasternal discomfort which is nonexertional and last for several minutes.  It may follow paroxysms of coughing. ?Otherwise, the patient?indicates that she has been stable?and reports no angina, congestive symptoms, palpitations, sensation of sustained forceful heart pounding, lightheadedness or syncope.? She has been walking her dog for 20 minutes a day for exercise. ?The patient reports no myalgias, bleeding abnormalities, or strokelike symptoms.??She reports no claudication.? She has been taking pravastatin 40 mg twice daily instead of 80 mg once a day because she indicates that it is easier on her digestive [place].  Historically,?Haley [name] has previously smoked cigarettes and smoked for 15-20 years,?averaging three quarters of a pack per day. ?She stopped smoking in 2017. ?Ms.?Knox [name]?has recurrent episodes of bronchitis, and will occasionally have several episodes a year. ?The?patient reports that she underwent colonoscopy in the spring 2019?and?that 10 polyps were found but no cancer. ?She also underwent left hernia repair in April 2019 without complications. Haley [name] reports no prior history of coronary artery disease, myocardial infarction, congestive heart failure, transient ischemic attack, stroke, or symptomatic peripheral arterial disease. ?She did obtain an echo Doppler study on November 30, 2016 because of nocturnal hypoxia. ?This suggested?an echodensity with?a fluid collection adjacent to the right atrium and led to a CT scan scan of the thorax. ?The CT scan of the thorax showed a moderate amount of calcified plaque within the aortic arch as well as focal calcified plaque within the proximal left anterior descending coronary artery. ?This led to obtaining a pharmacologic stress test which was low risk for coronary events.In April 2020 she developed an?upper respiratory tract infection which improved with antibiotics and prednisone.? She was hospitalized for 3 days in March 2021 with diverticulitis and received antibiotics.  Afterwards she developed C. difficile required treatment with vancomycin.  She was treated for urinary tract infection with Cipro in June 2021.          Vitals:    05/20/20 1414 05/20/20 1431   BP: 126/70 124/68   BP Source: Arm, Left Upper Arm, Right Upper   Pulse: 80    SpO2: 97%    O2 Device: None (Room air)    PainSc: Zero    Weight: 66.3 kg (146 lb 3.2 oz)    Height: 160 cm (5' 3)      Body mass index is 25.9 kg/m?Marland Kitchen     Past Medical History  Patient Active Problem List    Diagnosis Date Noted   ? Essential hypertension 01/12/2017     05/27/2018 ECHO DOPPLER:    ? Left Ventricle: Normal size and wall thickness. Concentric remodeling. Normal ejection fraction with LVEF=65%. No segmental wall motion abnormalities.  ? Right Ventricle: Normal size, wall thickness and ejection fraction.  ? Normal biatrial size.  ? There is no no significant valve disease.  ? Estimated Peak Systolic PA Pressure 27 mmHg  ? No pericardial effusion.  ? No prior for comparison.     ? DM2 (diabetes mellitus, type 2) (HCC) 10/30/2012     on metformin     ? Sinus  problem 10/30/2012   ? HLD (hyperlipidemia) 10/30/2012   ? GERD (gastroesophageal reflux disease) 10/30/2012   ? Breast mass 10/25/2012     DIAGNOSIS:  Left breast mass on outside imaging     HISTORY:  Haley [name] is a Caucasian female who presented to the [facility] on 10/30/2012 at age 85 for evaluation of a left breast mass on outside imaging.   BREAST IMAGING:  Mammogram:  Bilateral screening mammogram 06/12/12 [facility]) revealed a 6 mm mass in the lateral left breast. There was an oil cyst adjacent to the mass. No abnormalities in the right breast. 6 month follow up left mammogram was recommended. Left diagnostic mammogram 10/17/12 [facility]) revealed a 7 mm density in the lateral breast which had in size from May 2014. There was an oil cyst anterior to the lesion.  Ultrasound:  Left breast ultrasound 10/17/12 [facility]) revealed an oil cyst between 2-4:00. There was no correlation to mammogram finding.    REPRODUCTIVE HEALTH:  Age at first Menarche:  24  Age at First Live Birth: 12   Age at Menopause:  unknown, had hysterectomy, still has ovaries, unsure what year  Gravida:  3  Para: 3  Breastfeeding:  N/A    PERTINENT PMH:  Sinus trouble, DM2, hyperlipidemia, GERD, HTN  FAMILY HISTORY:  No family history of breast, ovarian, prostate or pancreatic cancer  PHYSICAL EXAM on PRESENTATION:  Right - No palpable breast masses. No skin, nipple, or areolar change. Left - No palpable breast masses. No skin, nipple, or areolar change. No supraclavicular or axillary adenopathy.  REFERRED BY:  Dr. Steva Ready             Review of Systems   Constitutional: Positive for malaise/fatigue.   HENT: Negative.    Eyes: Negative.    Cardiovascular: Positive for dyspnea on exertion and leg swelling.   Respiratory: Positive for shortness of breath.    Endocrine: Negative.    Hematologic/Lymphatic: Bruises/bleeds easily.   Skin: Negative.    Musculoskeletal: Positive for back pain, joint pain, muscle cramps and myalgias.   Gastrointestinal: Positive for bloating and nausea.   Genitourinary: Positive for frequency.   Neurological: Negative.    Psychiatric/Behavioral: Negative.    Allergic/Immunologic: Negative.        Physical Exam  GENERAL: The patient is well developed, well nourished, resting comfortably and in no distress.   HEENT: No abnormalities of the visible oro-nasopharynx, conjunctiva or sclera are noted.  NECK: There is no jugular venous distension. Carotids are palpable and without bruits. There is no thyroid enlargement.  Chest: Lung fields are clear to auscultation. There are no wheezes or crackles.  CV: There is a regular rhythm. The first and second heart sounds are normal. There are no murmurs, gallops or rubs.??Her apical heart rate is?72?bpm.  ABD: The abdomen is soft and supple with normal bowel sounds. There is no hepatosplenomegaly, ascites, tenderness, masses or bruits.  Neuro: There are no focal motor defects. Ambulation is normal. Cognitive function appears normal.  Ext:?Trace bipedal?edema is noted without?evidence of deep vein thrombosis. Peripheral pulses are satisfactory. ?  SKIN:?There are no rashes and no cellulitis  PSYCH:?The patient is calm, rationale and oriented.    Cardiovascular Studies  A twelve-lead ECG was obtained on 05/20/2020 reveals normal sinus rhythm with a heart rate of 73 bpm.  There is no evidence of myocardial ischemia or infarction.  Labs from 01/06/2020 revealed serum potassium 4.0 and serum creatinine 1.09 mg/dL.    Echo  Doppler 05/27/2018:?  ? Left Ventricle: Normal size and wall thickness. Concentric remodeling. Normal ejection fraction with LVEF=65%. No segmental wall motion abnormalities.  ? Right Ventricle: Normal size, wall thickness and ejection fraction.  ? Normal biatrial size.  ? There is no no significant valve disease.  ? Estimated Peak Systolic PA Pressure 27 mmHg  ? No pericardial effusion.  ? No prior for comparison.  ?  Regadenoson thallium stress test 05/27/2018:  Scintigraphic (planar/tomographic):???There are no perfusion defects. ?All myocardial segments appear viable.?Polar coordinate map identifies no perfusion abnormalities. TID Ratio: ?1.11 ?(normal <1.36). Summed Stress Score: ?0 ??, Summed Rest Score: ?0.?Regional Wall Thickening and Motion Post Stress: ??There is normal left ventricular wall motion and thickening of all myocardial segments. Left Ventricular Ejection Fraction (post stress, in the resting state) =??83 %. ?Left Ventricular End Diastolic Volume: 36 mL  SUMMARY/OPINION:??This study is normal with no evidence of significant myocardial ischemia. ?The overall calculated left ventricular systolic function is normal. ?There is normal regional wall motion and thickening in all segments. ?There are no high risk prognostic indicators present. ?The pharmacologic ECG portion of the study is negative for ischemia. Comparison was made to a prior study performed on February 02, 2017, also on the ADAC camera. ?That study demonstrated a small fixed apical defect which was felt to be secondary to soft tissue attenuation. ?The calculated EF was 64% with an end-diastolic volume of 44 mL. ?Comparing the 2 studies qualitatively, there has been no significant interval changes. In aggregate the current study is low risk in regards to predicted annual cardiovascular mortality rate.    Cardiovascular Health Factors  Vitals BP Readings from Last 3 Encounters:   05/20/20 124/68   08/12/19 130/72   03/20/19 (!) 146/82     Wt Readings from Last 3 Encounters:   05/20/20 66.3 kg (146 lb 3.2 oz)   08/12/19 63.8 kg (140 lb 9.6 oz)   03/20/19 67.1 kg (148 lb)     BMI Readings from Last 3 Encounters:   05/20/20 25.90 kg/m?   08/12/19 24.91 kg/m?   03/20/19 26.22 kg/m?      Smoking Social History     Tobacco Use   Smoking Status Former Smoker   ? Types: Cigarettes   ? Quit date: 10/30/2016   ? Years since quitting: 3.5   Smokeless Tobacco Never Used      Lipid Profile Cholesterol   Date Value Ref Range Status   08/12/2018 144  Final     HDL   Date Value Ref Range Status   08/12/2018 43  Final     LDL   Date Value Ref Range Status   08/12/2018 82  Final     Triglycerides   Date Value Ref Range Status   08/12/2018 93  Final      Blood Sugar Hemoglobin A1C   Date Value Ref Range Status   10/27/2016 5.5  Final     Glucose   Date Value Ref Range Status   01/06/2020 99  Final   11/03/2019 117 (H) 70 - 105 Final 09/03/2019 95  Final          Problems Addressed Today  Encounter Diagnoses   Name Primary?   ? Essential hypertension Yes   ? Other hyperlipidemia        Assessment and Plan     Ms. Sharay Boast?appears stable from a cardiovascular perspective.  Her rare chest pain is chronic and nondiagnostic.  Sometimes it follows paroxysms of coughing.  It is not exertional or associated with emotional stress and is not associated with dyspnea, diaphoresis or lightheadedness.  This rare chest discomfort is not very bothersome to her and she does not want to repeat her stress test.  She reports no angina or congestive symptoms and her blood pressure currently is well controlled.  Regular?mild?aerobic exercise and adherence to a heart healthy diet were recommended.?I have asked the patient to keep a log book of her?BP readings and to report BP readings exceeding 130/80 mm Hg.?I have asked her to return for follow-up in?6?months         Current Medications (including today's revisions)  ? acetaminophen (TYLENOL) 500 mg tablet Take 500 mg by mouth every 4 hours as needed for Pain. Max of 4,000 mg of acetaminophen in 24 hours.    ? albuterol (PROAIR HFA, VENTOLIN HFA, OR PROVENTIL HFA) 90 mcg/actuation inhaler Inhale 2 puffs by mouth into the lungs four times daily as needed for Wheezing or Shortness of Breath. Shake well before use.    ? amLODIPine (NORVASC) 10 mg tablet Take 10 mg by mouth daily.   ? aspirin EC 81 mg tablet Take one tablet by mouth daily. Take with food.   ? escitalopram oxalate (LEXAPRO) 20 mg tablet Take 20 mg by mouth daily.   ? fexofenadine (ALLEGRA ALLERGY) 180 mg tablet Take 180 mg by mouth daily.   ? fluticasone (FLONASE) 50 mcg/actuation nasal spray Apply 2 sprays to each nostril as directed as Needed.   ? L.acid/L.casei/B.bif/B.lon/FOS (PROBIOTIC BLEND PO) Take 1 capsule by mouth daily.   ? losartan(+) (COZAAR) 100 mg tablet Take 100 mg by mouth daily.   ? metoclopramide (REGLAN) 10 mg tablet Take 10 mg by mouth as Needed for Nausea.   ? omeprazole DR (PRILOSEC) 40 mg capsule Take 40 mg by mouth twice daily.   ? potassium chloride SR (K-DUR) 20 mEq tablet Take 1 tablet by mouth daily. Patient takes 1 tablet daily and takes 2 tablets daily on days she takes Torsemide   ? pravastatin (PRAVACHOL) 80 mg tablet TAKE 1 TABLET BY MOUTH EVERY DAY AT BEDTIME   ? sennosides/docusate sodium (SENNA PLUS PO) Take 2 tablets by mouth at bedtime daily.   ? sucralfate (CARAFATE) 1 gram tablet Take 1 g by mouth as Needed. Take on an empty stomach.    ? SYMBICORT 160-4.5 mcg/actuation inhalation INHALE 2 PUFFS BY MOUTH TWICE DAILY IN THE MORNING AND IN THE EVENING RINSE MOUTH AND THROAT AFTER USE   ? torsemide (DEMADEX) 20 mg tablet Take 20 mg by mouth as Needed.   ? zolpidem (AMBIEN) 10 mg tablet Take 10 mg by mouth at bedtime as needed.

## 2020-12-13 ENCOUNTER — Encounter: Admit: 2020-12-13 | Discharge: 2020-12-13 | Payer: MEDICARE

## 2020-12-13 MED ORDER — PRAVASTATIN 80 MG PO TAB
ORAL_TABLET | Freq: Every day | ORAL | 3 refills | 90.00000 days | Status: AC
Start: 2020-12-13 — End: ?

## 2021-01-20 ENCOUNTER — Encounter: Admit: 2021-01-20 | Discharge: 2021-01-20 | Payer: MEDICARE

## 2021-02-01 ENCOUNTER — Encounter: Admit: 2021-02-01 | Discharge: 2021-02-01 | Payer: MEDICARE

## 2021-02-01 ENCOUNTER — Ambulatory Visit: Admit: 2021-02-01 | Discharge: 2021-02-02 | Payer: MEDICARE

## 2021-02-01 DIAGNOSIS — E119 Type 2 diabetes mellitus without complications: Secondary | ICD-10-CM

## 2021-02-01 DIAGNOSIS — I10 Essential (primary) hypertension: Secondary | ICD-10-CM

## 2021-02-01 DIAGNOSIS — E7849 Other hyperlipidemia: Secondary | ICD-10-CM

## 2021-02-01 DIAGNOSIS — E1169 Type 2 diabetes mellitus with other specified complication: Secondary | ICD-10-CM

## 2021-02-01 DIAGNOSIS — E785 Hyperlipidemia, unspecified: Secondary | ICD-10-CM

## 2021-02-01 DIAGNOSIS — J3489 Other specified disorders of nose and nasal sinuses: Secondary | ICD-10-CM

## 2021-02-01 DIAGNOSIS — K219 Gastro-esophageal reflux disease without esophagitis: Secondary | ICD-10-CM

## 2021-02-01 NOTE — Patient Instructions
Thank you for visiting our office today.    We would like to make the following medication adjustments: NONE       Otherwise continue the same medications as you have been doing.          We will be pursuing the following tests after your appointment today:       Orders Placed This Encounter    LIPID PROFILE         We will plan to see you back in 6 months.  Please call us in the meantime with any questions or concerns.        Please allow 5-7 business days for our providers to review your results. All normal results will go to MyChart. If you do not have Mychart, it is strongly recommended to get this so you can easily view all your results. If you do not have mychart, we will attempt to call you once with normal lab and testing results. If we cannot reach you by phone with normal results, we will send you a letter.  If you have not heard the results of your testing after one week please give us a call.       Your [facility] [place] [facility] Brett Canales(Steve, Pilar JarvisLisa, Jamie, Shawna OrleansMelanie, and [place]ates[place])  phone number is [phone].

## 2021-02-01 NOTE — Progress Notes
Date of Service: 02/01/2021    Haley [name] is a 76 y.o. female.       HPI    Haley [name]?is followed for chronic dyspnea with exertion.??She has been followed for chronic obstructive pulmonary disease,?hypertension and evidently heart failure with preserved ejection fraction.? Haley [name] reports having a mild case of COVID in July 2022.  She also fell back in April 2022 and reports receiving back injections.  Her back discomfort had curtailed her walking exercise, however, her back discomfort has improved and she is planning on resuming her walking.  She develops dyspnea walking up stairs but can walk a mile and a half on a flat surface at a relatively slow pace. Her blood pressure has been well controlled in recent months.  She reports that her blood pressure is almost always less than 130/80 mmHg.  ?Otherwise, the patient?indicates that she has been stable?and reports no angina, congestive symptoms, palpitations, sensation of sustained forceful heart pounding, lightheadedness or syncope.??The patient reports no myalgias, claudication, bleeding abnormalities,?or strokelike symptoms. She has been taking pravastatin 80 mg once a day and has been tolerating this medication without adverse effects.  Previously she has developed myalgias and other statin medications.  Historically,?Haley [name] has previously smoked cigarettes and smoked for 15-20 years,?averaging three quarters of a pack per day. ?She stopped smoking in 2017. ?Ms.?Haley [name]?has recurrent episodes of bronchitis, and will occasionally have several episodes a year. ?The?patient reports that she underwent colonoscopy in the spring 2019?and?that 10 polyps were found but no cancer. ?She also underwent left hernia repair in April 2019 without complications. Haley [name] reports no prior history of coronary artery disease, myocardial infarction, congestive heart failure, transient ischemic attack, stroke, or symptomatic peripheral arterial disease. ?She did obtain an echo Doppler study on November 30, 2016 because of nocturnal hypoxia. ?This suggested?an echodensity with?a fluid collection adjacent to the right atrium and led to a CT scan scan of the thorax. ?The CT scan of the thorax showed a moderate amount of calcified plaque within the aortic arch as well as focal calcified plaque within the proximal left anterior descending coronary artery. ?This led to obtaining a pharmacologic stress test which was low risk for coronary events.In April 2020 she developed an?upper respiratory tract infection which improved with antibiotics and prednisone.??She was hospitalized for 3 days in March 2021 with diverticulitis and received antibiotics. ?Afterwards she developed C. difficile required treatment with vancomycin. ?She was treated for urinary tract infection with Cipro in June 2021.        Vitals:    02/01/21 0949   BP: (!) 140/80   BP Source: Arm, Left Upper   Pulse: 73   SpO2: 97%   O2 Device: None (Room air)   PainSc: Zero   Weight: 65.8 kg (145 lb)   Height: 160 cm (5' 3)     Body mass index is 25.69 kg/m?Marland Kitchen     Past Medical History  Patient Active Problem List    Diagnosis Date Noted   ? Essential hypertension 01/12/2017     05/27/2018 ECHO DOPPLER:    ? Left Ventricle: Normal size and wall thickness. Concentric remodeling. Normal ejection fraction with LVEF=65%. No segmental wall motion abnormalities.  ? Right Ventricle: Normal size, wall thickness and ejection fraction.  ? Normal biatrial size.  ? There is no no significant valve disease.  ? Estimated Peak Systolic PA Pressure 27 mmHg  ? No pericardial effusion.  ? No prior for comparison.     ?  DM2 (diabetes mellitus, type 2) (HCC) 10/30/2012     on metformin     ? Sinus problem 10/30/2012   ? HLD (hyperlipidemia) 10/30/2012   ? GERD (gastroesophageal reflux disease) 10/30/2012   ? Breast mass 10/25/2012     DIAGNOSIS:  Left breast mass on outside imaging     HISTORY:  Haley [name] is a Caucasian female who [facility] on 10[facility]30[facility]2012 at age 3 for evaluation of a left breast mass on outside imaging.   BREAST IMAGING:  Mammogram:  Bilateral screening mammogram 06/12/12 [facility]) revealed a 6 mm mass in the lateral left breast. There was an oil cyst adjacent to the mass. No abnormalities in the right breast. 6 month follow up left mammogram was recommended. Left diagnostic mammogram 10/17/12 [facility]) revealed a 7 mm density in the lateral breast which had in size from May 2014. There was an oil cyst anterior to the lesion.  Ultrasound:  Left breast ultrasound 10/17/12 [facility]) revealed an oil cyst between 2-4:00. There was no correlation to mammogram finding.    REPRODUCTIVE HEALTH:  Age at first Menarche:  59  Age at First Live Birth: 4   Age at Menopause:  unknown, had hysterectomy, still has ovaries, unsure what year  Gravida:  3  Para: 3  Breastfeeding:  N/A    PERTINENT PMH:  Sinus trouble, DM2, hyperlipidemia, GERD, HTN  FAMILY HISTORY:  No family history of breast, ovarian, prostate or pancreatic cancer  PHYSICAL EXAM on PRESENTATION:  Right - No palpable breast masses. No skin, nipple, or areolar change. Left - No palpable breast masses. No skin, nipple, or areolar change. No supraclavicular or axillary adenopathy.  REFERRED BY:  Dr. Steva Ready             Review of Systems   Constitutional: Negative.   HENT: Negative.    Eyes: Negative.    Cardiovascular: Negative.    Respiratory: Negative.    Endocrine: Negative.    Hematologic/Lymphatic: Negative.    Skin: Negative.    Musculoskeletal: Negative.    Gastrointestinal: Negative.    Genitourinary: Negative.    Neurological: Negative.    Psychiatric/Behavioral: Negative.    Allergic/Immunologic: Negative.        Physical Exam  GENERAL: The patient is well developed, well nourished, resting comfortably and in no distress.   HEENT: No abnormalities of the visible oro-nasopharynx, conjunctiva or sclera are noted.  NECK: There is no jugular venous distension. Carotids are palpable and without bruits. There is no thyroid enlargement.  Chest: Lung fields are clear to auscultation. There are no wheezes or crackles.  CV: There is a regular rhythm. The first and second heart sounds are normal. There are no murmurs, gallops or rubs.??Her apical heart rate is?72?bpm.  ABD: The abdomen is soft and supple with normal bowel sounds. There is no hepatosplenomegaly, ascites, tenderness, masses or bruits.  Neuro: There are no focal motor defects. Ambulation is normal. Cognitive function appears normal.  Ext:?Trace bipedal?edema is noted without?evidence of deep vein thrombosis. Peripheral pulses are satisfactory. ?  SKIN:?There are no rashes and no cellulitis  PSYCH:?The patient is calm, rationale and oriented.    Cardiovascular Studies  A twelve-lead ECG was obtained on 05/20/2020 reveals normal sinus rhythm with a heart rate of 73 bpm.  There is no evidence of myocardial ischemia or infarction.  Labs from 01/06/2020 revealed serum potassium 4.0 and serum creatinine 1.09 mg/dL.  ?  Echo Doppler 05/27/2018:?  ? Left  Ventricle: Normal size and wall thickness. Concentric remodeling. Normal ejection fraction with LVEF=65%. No segmental wall motion abnormalities.  ? Right Ventricle: Normal size, wall thickness and ejection fraction.  ? Normal biatrial size.  ? There is no no significant valve disease.  ? Estimated Peak Systolic PA Pressure 27 mmHg  ? No pericardial effusion.  ? No prior for comparison.  ?  Regadenoson thallium stress test 05/27/2018:  Scintigraphic (planar/tomographic):???There are no perfusion defects. ?All myocardial segments appear viable.?Polar coordinate map identifies no perfusion abnormalities. TID Ratio: ?1.11 ?(normal <1.36). Summed Stress Score: ?0 ??, Summed Rest Score: ?0.?Regional Wall Thickening and Motion Post Stress: ??There is normal left ventricular wall motion and thickening of all myocardial segments. Left Ventricular Ejection Fraction (post stress, in the resting state) =??83 %. ?Left Ventricular End Diastolic Volume: 36 mL  SUMMARY/OPINION:??This study is normal with no evidence of significant myocardial ischemia. ?The overall calculated left ventricular systolic function is normal. ?There is normal regional wall motion and thickening in all segments. ?There are no high risk prognostic indicators present. ?The pharmacologic ECG portion of the study is negative for ischemia. Comparison was made to a prior study performed on February 02, 2017, also on the ADAC camera. ?That study demonstrated a small fixed apical defect which was felt to be secondary to soft tissue attenuation. ?The calculated EF was 64% with an end-diastolic volume of 44 mL. ?Comparing the 2 studies qualitatively, there has been no significant interval changes. In aggregate the current study is low risk in regards to predicted annual cardiovascular mortality rate.    Cardiovascular Health Factors  Vitals BP Readings from Last 3 Encounters:   02/01/21 (!) 140/80   05/20/20 124/68   08/12/19 130/72     Wt Readings from Last 3 Encounters:   02/01/21 65.8 kg (145 lb)   05/20/20 66.3 kg (146 lb 3.2 oz)   08/12/19 63.8 kg (140 lb 9.6 oz)     BMI Readings from Last 3 Encounters:   02/01/21 25.69 kg/m?   05/20/20 25.90 kg/m?   08/12/19 24.91 kg/m?      Smoking Social History     Tobacco Use   Smoking Status Former   ? Types: Cigarettes   ? Quit date: 10/30/2016   ? Years since quitting: 4.2   Smokeless Tobacco Never      Lipid Profile Cholesterol   Date Value Ref Range Status   08/12/2018 144  Final     HDL   Date Value Ref Range Status   08/12/2018 43  Final     LDL   Date Value Ref Range Status   08/12/2018 82  Final     Triglycerides   Date Value Ref Range Status   08/12/2018 93  Final      Blood Sugar Hemoglobin A1C   Date Value Ref Range Status   10/27/2016 5.5  Final     Glucose   Date Value Ref Range Status   07/27/2020 118 (H) 70 - 105 Final   05/26/2020 104  Final 01/06/2020 99  Final          Problems Addressed Today  Encounter Diagnoses   Name Primary?   ? Type 2 diabetes mellitus with other specified complication, unspecified whether long term insulin use (HCC) Yes   ? Essential hypertension    ? Other hyperlipidemia        Assessment and Plan     Haley [name]?appears stable from a cardiovascular perspective. She reports no angina or  congestive symptoms and her blood pressure currently is well controlled, when checked outside the office. ?Regular?mild?aerobic exercise and adherence to a heart healthy diet were recommended.?I have asked the patient to keep a log book of her?BP readings and to report BP readings exceeding 130/80?mm Hg.  Haley [name] was given a requisition to check her fasting lipid profile.  If her LDL cholesterol is above 70 mg/dL, then I have asked her to start Zetia 10 mg daily, in addition to her pravastatin. ?I have asked her to return for follow-up in?6?months. The total time spent during this interview and exam was 30 minutes.                   Current Medications (including today's revisions)  ? acetaminophen (TYLENOL) 500 mg tablet Take 500 mg by mouth every 4 hours as needed for Pain. Max of 4,000 mg of acetaminophen in 24 hours.    ? albuterol (PROAIR HFA, VENTOLIN HFA, OR PROVENTIL HFA) 90 mcg/actuation inhaler Inhale 2 puffs by mouth into the lungs four times daily as needed for Wheezing or Shortness of Breath. Shake well before use.    ? amLODIPine (NORVASC) 10 mg tablet Take 10 mg by mouth daily.   ? aspirin EC 81 mg tablet Take one tablet by mouth daily. Take with food.   ? calcium carbonate (TUMS PO) Take  by mouth.   ? ERGOcalciferoL (vitamin D2) (DRISDOL) 1,250 mcg (50,000 unit) capsule TAKE 1 CAPSULE BY MOUTH ONCE A WEEK THEN TRANSITION TO VITAMIN D 2,000 IU ONCE DAILY   ? escitalopram oxalate (LEXAPRO) 20 mg tablet Take 20 mg by mouth daily.   ? fexofenadine (ALLEGRA) 180 mg tablet Take 180 mg by mouth daily.   ? fluticasone (FLONASE) 50 mcg/actuation nasal spray Apply 2 sprays to each nostril as directed as Needed.   ? L.acid/L.casei/B.bif/B.lon/FOS (PROBIOTIC BLEND PO) Take 1 capsule by mouth daily.   ? losartan(+) (COZAAR) 100 mg tablet Take 100 mg by mouth daily.   ? metoclopramide (REGLAN) 10 mg tablet Take 10 mg by mouth as Needed for Nausea.   ? omeprazole DR (PRILOSEC) 40 mg capsule Take 40 mg by mouth twice daily.   ? potassium chloride SR (K-DUR) 20 mEq tablet Take 1 tablet by mouth daily. Patient takes 1 tablet daily and takes 2 tablets daily on days she takes Torsemide   ? pravastatin (PRAVACHOL) 80 mg tablet TAKE 1 TABLET BY MOUTH ONCE DAILY AT BEDTIME   ? romosozumab-aqqg (EVENITY) 210mg /2.32mL ( 105mg /1.48mLx2) syringe Inject 210 mg under the skin every 28 days.   ? sennosides/docusate sodium (SENNA PLUS PO) Take 2 tablets by mouth at bedtime daily.   ? sucralfate (CARAFATE) 1 gram tablet Take 1 g by mouth as Needed. Take on an empty stomach.    ? SYMBICORT 160-4.5 mcg/actuation inhalation INHALE 2 PUFFS BY MOUTH TWICE DAILY IN THE MORNING AND IN THE EVENING RINSE MOUTH AND THROAT AFTER USE   ? torsemide (DEMADEX) 20 mg tablet Take 20 mg by mouth as Needed.   ? zolpidem (AMBIEN) 10 mg tablet Take 10 mg by mouth at bedtime as needed.

## 2021-02-02 ENCOUNTER — Encounter: Admit: 2021-02-02 | Discharge: 2021-02-02 | Payer: MEDICARE

## 2021-02-02 DIAGNOSIS — I10 Essential (primary) hypertension: Secondary | ICD-10-CM

## 2021-02-02 DIAGNOSIS — E7849 Other hyperlipidemia: Secondary | ICD-10-CM

## 2021-02-02 DIAGNOSIS — E1169 Type 2 diabetes mellitus with other specified complication: Secondary | ICD-10-CM

## 2021-02-02 LAB — LIPID PROFILE
CHOLESTEROL/HDL %: 3 FL (ref 7–11)
CHOLESTEROL: 169 % — ABNORMAL HIGH (ref 40–50)
HDL: 56 pg (ref 26–34)
LDL: 98 g/dL (ref 32.0–36.0)
TRIGLYCERIDES: 79 FL (ref 80–100)
VLDL: 16 % (ref 11–15)

## 2021-02-02 MED ORDER — EZETIMIBE 10 MG PO TAB
10 mg | ORAL_TABLET | Freq: Every day | ORAL | 3 refills | Status: AC
Start: 2021-02-02 — End: ?

## 2021-02-02 NOTE — Telephone Encounter
Left a voicemail with results and medication changes. Callback number provided for further questions or concerns.

## 2021-02-02 NOTE — Telephone Encounter
-----   Message from Hester Mates, MD sent at 02/02/2021 11:35 AM CST -----  Romain Erion: Start Zetia 10 mg daily.  Thanks.  SBG  ----- Message -----  From: Lauralee Evener, RN  Sent: 02/02/2021  11:02 AM CST  To: Hester Mates, MD    LDL is 98, in office note you said to start zetia 10 mg if above goal. I will send in the prescription and let the patient know.

## 2021-05-16 ENCOUNTER — Encounter: Admit: 2021-05-16 | Discharge: 2021-05-16 | Payer: MEDICARE

## 2021-05-19 ENCOUNTER — Encounter: Admit: 2021-05-19 | Discharge: 2021-05-19 | Payer: MEDICARE

## 2021-05-19 NOTE — Telephone Encounter
Edema    Location: bilateral and lower extremity feet and ankles  Onset: 2-3 weeks  Patient description: Patient does report tenderness and pitting properties.   Associated symptoms: Patient denies any associated symptoms. Patient states that she has COPD and she is always short of breath. She states that this has not been any worse since she has noticed increased swelling.   Recent travel/prolonged sitting: No     Additional relevant information:   Only recent medication change has been the addition of Zetia. Patient is on Norvasc 10mg  daily, but she states that she has been on this for years without any issues. Patient does also have PRN Torsemide 20mg  daily.  She states that she takes this about 2-3 times per week. She states that sometimes this temporarily relieves the swelling and other times she doesn't feel a difference. She states overall that the medication seems to temporarily relieve the swelling that has recently worsened in frequency. She is concerned for the increasing need for this medication.     Patient states that she takes daily weights. She states that her weight has not varied much. She states only about 1-2lb variations. She states that her weight did increase 2lbs today.  She states that her blood pressures have been well controlled 100s-120s/70s with HR ranging 70s-80s.     Patient states that she was recently hospitalized at [facility] for GI related issues back in March. She states that she did receive IV fluids there. Patient states that she had a period of time after her hospital stay of normalcy prior to noticing the swelling in her lower extremities.     Care Advice: Educated patient on low sodium diet and the importance of elevating feet when sitting in a chair. Urged patient to keep follow up appt with Dr. [facility] in June. Patient verbalized understanding.    Will route to SBG for review and recommendations.     Disposition: RN to Call Patient Back with Provider Recommendations

## 2021-05-19 NOTE — Telephone Encounter
Hester Mates, MD  Florene Route, RN; Weston Brass  Caller: Unspecified (Today, 11:16 AM)  Adelina Mings: Difficult to add much without seeing her in clinic. I would recommend a clinic visit with either her primary care physician or with [facility]. Certainly she should go to the emergency room for any urgent symptoms. Thanks. SBG       Discussed recommendations with patient. Patient is going to schedule appointment with PCP. Patient verified D-T-L of appointment with Dr. Arna Medici in June. Urged patient to reach out with questions or concerns.

## 2021-06-24 ENCOUNTER — Encounter: Admit: 2021-06-24 | Discharge: 2021-06-24 | Payer: MEDICARE

## 2021-07-12 ENCOUNTER — Encounter: Admit: 2021-07-12 | Discharge: 2021-07-12 | Payer: MEDICARE

## 2021-07-12 DIAGNOSIS — Z136 Encounter for screening for cardiovascular disorders: Secondary | ICD-10-CM

## 2021-07-12 DIAGNOSIS — J449 Chronic obstructive pulmonary disease, unspecified: Secondary | ICD-10-CM

## 2021-07-12 DIAGNOSIS — E079 Disorder of thyroid, unspecified: Secondary | ICD-10-CM

## 2021-07-12 DIAGNOSIS — K219 Gastro-esophageal reflux disease without esophagitis: Secondary | ICD-10-CM

## 2021-07-12 DIAGNOSIS — E785 Hyperlipidemia, unspecified: Secondary | ICD-10-CM

## 2021-07-12 DIAGNOSIS — M1990 Unspecified osteoarthritis, unspecified site: Secondary | ICD-10-CM

## 2021-07-12 DIAGNOSIS — M810 Age-related osteoporosis without current pathological fracture: Secondary | ICD-10-CM

## 2021-07-12 DIAGNOSIS — I10 Essential (primary) hypertension: Secondary | ICD-10-CM

## 2021-07-12 DIAGNOSIS — Z9981 Dependence on supplemental oxygen: Secondary | ICD-10-CM

## 2021-07-12 DIAGNOSIS — J3489 Other specified disorders of nose and nasal sinuses: Secondary | ICD-10-CM

## 2021-07-12 DIAGNOSIS — E119 Type 2 diabetes mellitus without complications: Secondary | ICD-10-CM

## 2021-07-12 NOTE — Progress Notes
Date of Service: 07/12/2021    Haley [name] is a 76 y.o. female.       HPI    Haley [name]?is followed for chronic dyspnea with exertion.??I noticed that she was hospitalized from April 14, 2021 until April 28, 2021 at [facility] for rectal bleeding and abdominal discomfort.  Her chart indicates that colonoscopy was performed and a 6 mm polyp from the ascending colon was cauterized.  There was mild mucosal inflammatory change involving the distal transverse colon suggestive of resolving ischemic colitis.  Her symptoms resolved and she was discharged home.  Haley [name] has been followed for chronic obstructive pulmonary disease,?hypertension and evidently heart failure with preserved ejection fraction.? She develops dyspnea walking up stairs but can walk 2 miles on a flat surface at a relatively slow pace.?Her blood pressure has been well controlled in recent months.  She reports that her blood pressure is almost always less than 120-125/75 mmHg.? Haley [name] states that she takes her torsemide once or twice a week to control her congestive symptoms and edema. ?Otherwise, the patient?indicates that she has been stable?and reports no angina, congestive symptoms, palpitations, sensation of sustained forceful heart pounding, lightheadedness or syncope.??The patient reports no myalgias, claudication, bleeding abnormalities,?or strokelike symptoms.?She has been taking pravastatin?80 mg once a day and has been tolerating this medication without adverse effects.  Previously she has developed myalgias on other statin medications.  She was placed on Zetia and did not tolerate this because she says it gave her myalgias.  Historically,?Haley [name] has previously smoked cigarettes and smoked for 15-20 years,?averaging three quarters of a pack per day. ?She stopped smoking in 2017. ?Ms.?Knox [name]?has recurrent episodes of bronchitis, and will occasionally have several episodes a year. ?The?patient reports that she underwent colonoscopy in the spring 2019?and?that 10 polyps were found but no cancer. ?She also underwent left hernia repair in April 2019 without complications. Haley [name] reports no prior history of coronary artery disease, myocardial infarction, congestive heart failure, transient ischemic attack, stroke, or symptomatic peripheral arterial disease. ?She did obtain an echo Doppler study on November 30, 2016 because of nocturnal hypoxia. ?This suggested?an echodensity with?a fluid collection adjacent to the right atrium and led to a CT scan scan of the thorax. ?The CT scan of the thorax showed a moderate amount of calcified plaque within the aortic arch as well as focal calcified plaque within the proximal left anterior descending coronary artery. ?This led to obtaining a pharmacologic stress test which was low risk for coronary events.In April 2020 she developed an?upper respiratory tract infection which improved with antibiotics and prednisone.??She was hospitalized for 3 days in March 2021 with diverticulitis and received antibiotics. ?Afterwards she developed C. difficile required treatment with vancomycin. ?She was treated for urinary tract infection with Cipro in June 2021.?Haley [name] reports having a mild case of COVID in July 2022.  She also fell back in April 2022 and reports receiving back injections.         Vitals:    07/12/21 1028   BP: 138/74   BP Source: Arm, Left Upper   Pulse: 76   SpO2: 96%   O2 Percent: 96 %   O2 Device: None (Room air)   PainSc: Zero   Weight: 65 kg (143 lb 6.4 oz)   Height: 160 cm (5' 3)     Body mass index is 25.4 kg/m?Marland Kitchen     Past Medical History  Patient Active Problem List    Diagnosis Date Noted   ?  Essential hypertension 01/12/2017     05/27/2018 ECHO DOPPLER:    ? Left Ventricle: Normal size and wall thickness. Concentric remodeling. Normal ejection fraction with LVEF=65%. No segmental wall motion abnormalities.  ? Right Ventricle: Normal size, wall thickness and ejection fraction.  ? Normal biatrial size.  ? There is no no significant valve disease.  ? Estimated Peak Systolic PA Pressure 27 mmHg  ? No pericardial effusion.  ? No prior for comparison.     ? DM2 (diabetes mellitus, type 2) (HCC) 10/30/2012     on metformin     ? Sinus problem 10/30/2012   ? HLD (hyperlipidemia) 10/30/2012   ? GERD (gastroesophageal reflux disease) 10/30/2012   ? Breast mass 10/25/2012     DIAGNOSIS:  Left breast mass on outside imaging     HISTORY:  Haley [name] is a Caucasian female who presented to the [facility] on 10/30/2012 at age 69 for evaluation of a left breast mass on outside imaging.   BREAST IMAGING:  Mammogram:  Bilateral screening mammogram 06/12/12 [facility]) revealed a 6 mm mass in the lateral left breast. There was an oil cyst adjacent to the mass. No abnormalities in the right breast. 6 month follow up left mammogram was recommended. Left diagnostic mammogram 10/17/12 [facility]) revealed a 7 mm density in the lateral breast which had in size from May 2014. There was an oil cyst anterior to the lesion.  Ultrasound:  Left breast ultrasound 10/17/12 [facility]) revealed an oil cyst between 2-4:00. There was no correlation to mammogram finding.    REPRODUCTIVE HEALTH:  Age at first Menarche:  12  Age at First Live Birth: 62   Age at Menopause:  unknown, had hysterectomy, still has ovaries, unsure what year  Gravida:  3  Para: 3  Breastfeeding:  N/A    PERTINENT PMH:  Sinus trouble, DM2, hyperlipidemia, GERD, HTN  FAMILY HISTORY:  No family history of breast, ovarian, prostate or pancreatic cancer  PHYSICAL EXAM on PRESENTATION:  Right - No palpable breast masses. No skin, nipple, or areolar change. Left - No palpable breast masses. No skin, nipple, or areolar change. No supraclavicular or axillary adenopathy.  REFERRED BY:  Dr. Steva Ready             Review of Systems   Constitutional: Positive for malaise/fatigue.   Eyes: Negative.    Cardiovascular: Positive for dyspnea on exertion and leg swelling.   Respiratory: Positive for cough.    Endocrine: Positive for polyuria.   Hematologic/Lymphatic: Negative.    Skin: Negative.    Musculoskeletal: Positive for muscle cramps and myalgias.   Gastrointestinal: Negative.    Genitourinary: Positive for frequency.   Neurological: Positive for headaches, numbness and paresthesias.   Psychiatric/Behavioral: Negative.    Allergic/Immunologic: Negative.        Physical Exam  GENERAL: The patient is well developed, well nourished, resting comfortably and in no distress.   HEENT: No abnormalities of the visible oro-nasopharynx, conjunctiva or sclera are noted.  NECK: There is no jugular venous distension. Carotids are palpable and without bruits. There is no thyroid enlargement.  Chest: Lung fields are clear to auscultation. There are no wheezes or crackles.  CV: There is a regular rhythm. The first and second heart sounds are normal. There are no murmurs, gallops or rubs.??Her apical heart rate is?76?bpm.  ABD: The abdomen is soft and supple with normal bowel sounds. There is no hepatosplenomegaly, ascites, tenderness, masses or bruits.  Neuro: There are  no focal motor defects. Ambulation is normal. Cognitive function appears normal.  Ext:?1+ bipedal?edema is noted without?evidence of deep vein thrombosis. Peripheral pulses are satisfactory. ?  SKIN:?There are no rashes and no cellulitis  PSYCH:?The patient is calm, rationale and oriented.    Cardiovascular Studies  A twelve-lead ECG obtained on 07/12/2021 reveals normal sinus rhythm with a heart rate of 70 bpm.  Nondiagnostic ST-T wave abnormalities are seen.  Echo Doppler 05/27/2018:?  ? Left Ventricle: Normal size and wall thickness. Concentric remodeling. Normal ejection fraction with LVEF=65%. No segmental wall motion abnormalities.  ? Right Ventricle: Normal size, wall thickness and ejection fraction.  ? Normal biatrial size.  ? There is no no significant valve disease.  ? Estimated Peak Systolic PA Pressure 27 mmHg  ? No pericardial effusion.  ? No prior for comparison.  ?  Regadenoson thallium stress test 05/27/2018:  Scintigraphic (planar/tomographic):???There are no perfusion defects. ?All myocardial segments appear viable.?Polar coordinate map identifies no perfusion abnormalities. TID Ratio: ?1.11 ?(normal <1.36). Summed Stress Score: ?0 ??, Summed Rest Score: ?0.?Regional Wall Thickening and Motion Post Stress: ??There is normal left ventricular wall motion and thickening of all myocardial segments. Left Ventricular Ejection Fraction (post stress, in the resting state) =??83 %. ?Left Ventricular End Diastolic Volume: 36 mL  SUMMARY/OPINION:??This study is normal with no evidence of significant myocardial ischemia. ?The overall calculated left ventricular systolic function is normal. ?There is normal regional wall motion and thickening in all segments. ?There are no high risk prognostic indicators present. ?The pharmacologic ECG portion of the study is negative for ischemia. Comparison was made to a prior study performed on February 02, 2017, also on the ADAC camera. ?That study demonstrated a small fixed apical defect which was felt to be secondary to soft tissue attenuation. ?The calculated EF was 64% with an end-diastolic volume of 44 mL. ?Comparing the 2 studies qualitatively, there has been no significant interval changes. In aggregate the current study is low risk in regards to predicted annual cardiovascular mortality rate.  Cardiovascular Health Factors  Vitals BP Readings from Last 3 Encounters:   07/12/21 138/74   02/01/21 (!) 140/80   05/20/20 124/68     Wt Readings from Last 3 Encounters:   07/12/21 65 kg (143 lb 6.4 oz)   02/01/21 65.8 kg (145 lb)   05/20/20 66.3 kg (146 lb 3.2 oz)     BMI Readings from Last 3 Encounters:   07/12/21 25.40 kg/m?   02/01/21 25.69 kg/m?   05/20/20 25.90 kg/m?      Smoking Social History     Tobacco Use   Smoking Status Former   ? Packs/day: 1.00   ? Years: 15.00 ? Pack years: 15.00   ? Types: Cigarettes   ? Quit date: 10/30/2016   ? Years since quitting: 4.7   Smokeless Tobacco Never      Lipid Profile Cholesterol   Date Value Ref Range Status   02/02/2021 169  Final     HDL   Date Value Ref Range Status   02/02/2021 56  Final     LDL   Date Value Ref Range Status   02/02/2021 98  Final     Triglycerides   Date Value Ref Range Status   02/02/2021 79  Final      Blood Sugar Hemoglobin A1C   Date Value Ref Range Status   10/27/2016 5.5  Final     Glucose   Date Value Ref Range Status   04/22/2021 136 (  H) 70 - 105 Final   07/27/2020 118 (H) 70 - 105 Final   05/26/2020 104  Final          Problems Addressed Today  Encounter Diagnoses   Name Primary?   ? Screening for heart disease Yes       Assessment and Plan     Haley [name]?appears stable from a cardiovascular perspective.?She reports no angina or congestive symptoms and her blood pressure currently is well controlled, when checked outside the office. ?Regular?mild?aerobic exercise and adherence to a heart healthy diet were recommended.?I have asked the patient to keep a log book of her?BP readings and to report BP readings exceeding 130/80?mm Hg.    She does not want to consider switching her statin therapy to a stronger statin or adding Repatha.  I have asked her to return for follow-up in?12?months. The total time spent during this interview and exam was 30 minutes.                      Current Medications (including today's revisions)  ? acetaminophen (TYLENOL) 500 mg tablet Take one tablet by mouth every 4 hours as needed for Pain. Max of 4,000 mg of acetaminophen in 24 hours.   ? albuterol (PROAIR HFA, VENTOLIN HFA, OR PROVENTIL HFA) 90 mcg/actuation inhaler Inhale two puffs by mouth into the lungs four times daily as needed for Wheezing or Shortness of Breath. Shake well before use.   ? amLODIPine (NORVASC) 10 mg tablet Take one tablet by mouth daily.   ? calcium carbonate (TUMS PO) Take 2 tablets by mouth daily. ? CHOLEcalciferoL (vitamin D3) (VITAMIN D3) 50 mcg (2,000 unit) tablet Take one tablet by mouth daily.   ? escitalopram oxalate (LEXAPRO) 20 mg tablet Take one tablet by mouth daily.   ? ezetimibe (ZETIA) 10 mg tablet Take one tablet by mouth daily. (Patient taking differently: Take one tablet by mouth daily. ON HOLD)   ? fexofenadine (ALLEGRA) 180 mg tablet Take one tablet by mouth daily.   ? fluticasone (FLONASE) 50 mcg/actuation nasal spray Apply two sprays to each nostril as directed as Needed.   ? L.acid/L.casei/B.bif/B.lon/FOS (PROBIOTIC BLEND PO) Take 1 capsule by mouth daily.   ? losartan(+) (COZAAR) 100 mg tablet Take one tablet by mouth daily.   ? metoclopramide (REGLAN) 10 mg tablet Take one tablet by mouth as Needed for Nausea.   ? omeprazole DR (PRILOSEC) 40 mg capsule Take one capsule by mouth twice daily.   ? potassium chloride SR (K-DUR) 20 mEq tablet Take one tablet by mouth daily. Patient takes 1 tablet daily and takes 2 tablets daily on days she takes Torsemide   ? pravastatin (PRAVACHOL) 80 mg tablet TAKE 1 TABLET BY MOUTH ONCE DAILY AT BEDTIME   ? romosozumab-aqqg (EVENITY) 210mg /2.80mL ( 105mg /1.69mLx2) syringe Inject 2.34 mL under the skin every 28 days.   ? sennosides/docusate sodium (SENNA PLUS PO) Take 2 tablets by mouth at bedtime daily.   ? sucralfate (CARAFATE) 1 gram tablet Take one tablet by mouth as Needed. Take on an empty stomach.   ? SYMBICORT 160-4.5 mcg/actuation inhalation INHALE 2 PUFFS BY MOUTH TWICE DAILY IN THE MORNING AND IN THE EVENING RINSE MOUTH AND THROAT AFTER USE   ? torsemide (DEMADEX) 20 mg tablet Take one tablet by mouth as Needed.   ? zolpidem (AMBIEN) 10 mg tablet Take one tablet by mouth at bedtime as needed.

## 2021-07-12 NOTE — Patient Instructions
Thank you for visiting our office today.    We would like to make the following medication adjustments:  NONE      Otherwise continue the same medications as you have been doing.          We will be pursuing the following tests after your appointment today:       Orders Placed This Encounter    ECG 12-LEAD         We will plan to see you back in 12 months.  Please call us in the meantime with any questions or concerns.        Please allow 5-7 business days for our providers to review your results. All normal results will go to MyChart. If you do not have Mychart, it is strongly recommended to get this so you can easily view all your results. If you do not have mychart, we will attempt to call you once with normal lab and testing results. If we cannot reach you by phone with normal results, we will send you a letter.  If you have not heard the results of your testing after one week please give us a call.       Your Cardiovascular [facility] [place] Team (Steve, Lisa, Jamie, Melanie, and Durell Lofaso)  phone number is [phone].

## 2021-12-16 ENCOUNTER — Encounter: Admit: 2021-12-16 | Discharge: 2021-12-16 | Payer: MEDICARE

## 2021-12-16 MED ORDER — PRAVASTATIN 80 MG PO TAB
ORAL_TABLET | ORAL | 3 refills | 90.00000 days | Status: AC
Start: 2021-12-16 — End: ?

## 2022-10-06 ENCOUNTER — Encounter: Admit: 2022-10-06 | Discharge: 2022-10-06 | Payer: MEDICARE

## 2022-10-10 ENCOUNTER — Encounter: Admit: 2022-10-10 | Discharge: 2022-10-10 | Payer: MEDICARE

## 2022-10-10 DIAGNOSIS — Z9981 Dependence on supplemental oxygen: Secondary | ICD-10-CM

## 2022-10-10 DIAGNOSIS — E079 Disorder of thyroid, unspecified: Secondary | ICD-10-CM

## 2022-10-10 DIAGNOSIS — J3489 Other specified disorders of nose and nasal sinuses: Secondary | ICD-10-CM

## 2022-10-10 DIAGNOSIS — M810 Age-related osteoporosis without current pathological fracture: Secondary | ICD-10-CM

## 2022-10-10 DIAGNOSIS — J449 Chronic obstructive pulmonary disease, unspecified: Secondary | ICD-10-CM

## 2022-10-10 DIAGNOSIS — M1990 Unspecified osteoarthritis, unspecified site: Secondary | ICD-10-CM

## 2022-10-10 DIAGNOSIS — Z136 Encounter for screening for cardiovascular disorders: Secondary | ICD-10-CM

## 2022-10-10 DIAGNOSIS — I10 Essential (primary) hypertension: Secondary | ICD-10-CM

## 2022-10-10 DIAGNOSIS — I729 Aneurysm of unspecified site: Secondary | ICD-10-CM

## 2022-10-10 DIAGNOSIS — K219 Gastro-esophageal reflux disease without esophagitis: Secondary | ICD-10-CM

## 2022-10-10 DIAGNOSIS — E119 Type 2 diabetes mellitus without complications: Secondary | ICD-10-CM

## 2022-10-10 DIAGNOSIS — E785 Hyperlipidemia, unspecified: Secondary | ICD-10-CM

## 2022-10-10 NOTE — Patient Instructions
Thank you for visiting our office today.    We would like to make the following medication adjustments:  NONE       Otherwise continue the same medications as you have been doing.          We will be pursuing the following tests after your appointment today:       Orders Placed This Encounter    ECG 12-LEAD         We will plan to see you back in 12 months.  Please call us in the meantime with any questions or concerns.        Please allow 5-7 business days for our providers to review your results. All normal results will go to MyChart. If you do not have Mychart, it is strongly recommended to get this so you can easily view all your results. If you do not have mychart, we will attempt to call you once with normal lab and testing results. If we cannot reach you by phone with normal results, we will send you a letter.  If you have not heard the results of your testing after one week please give us a call.       Your Cardi[facility] [place] Team (Steve, Lisa, Jamie, Melanie, and Devaunte Gasparini)  phone number is [phone].

## 2022-10-10 NOTE — Progress Notes
Date of Service: 10/10/2022    Haley [name] is a 77 y.o. female.       HPI    [name] is followed for chronic dyspnea with exertion.  [name] has been followed for chronic obstructive pulmonary disease, hypertension and hypercholesterolemia.  She develops dyspnea walking up stairs but can walk 2 miles on a flat surface at a relatively slow pace.  [name] indicates that she was hospitalized on 06/30/2022 for left abdominal discomfort.  She was hospitalized for several days and believes that she was treated with antibiotics.  She believes that perhaps she had diverticulitis.  She reports no recurrence over the past 3 months.  Her blood pressure has been well controlled in recent months.  She reports that her blood pressure is almost always less than 120-125/75 mmHg.  [name] states that she takes her torsemide once or twice a week to control her congestive symptoms and edema.  Otherwise, the patient indicates that she has been stable and reports no angina, congestive symptoms, palpitations, sensation of sustained forceful heart pounding, lightheadedness or syncope.  The patient reports no myalgias, claudication, bleeding abnormalities, or strokelike symptoms. She has been taking pravastatin 80 mg once a day and has been tolerating this medication without adverse effects.  Previously she has developed myalgias on other statin medications.  She was placed on Zetia and did not tolerate this because she says it gave her myalgias.  Historically, [name] has previously smoked cigarettes and smoked for 15-20 years, averaging three quarters of a pack per day.  She stopped smoking in 2017.  [name] has recurrent episodes of bronchitis, and will occasionally have several episodes a year.  The patient reports that she underwent colonoscopy in the spring 2019 and that 10 polyps were found but no cancer.  She also underwent left hernia repair in April 2019 without complications. [name] reports no prior history of coronary artery disease, myocardial infarction, congestive heart failure, transient ischemic attack, stroke, or symptomatic peripheral arterial disease.  She did obtain an echo Doppler study on November 30, 2016 because of nocturnal hypoxia.  This suggested an echodensity with a fluid collection adjacent to the right atrium and led to a CT scan scan of the thorax.  The CT scan of the thorax showed a moderate amount of calcified plaque within the aortic arch as well as focal calcified plaque within the proximal left anterior descending coronary artery.  This led to obtaining a pharmacologic stress test which was low risk for coronary events.In April 2020 she developed an upper respiratory tract infection which improved with antibiotics and prednisone.  She was hospitalized for 3 days in March 2021 with diverticulitis and received antibiotics.  Afterwards she developed C. difficile required treatment with vancomycin.  She was treated for urinary tract infection with Cipro in June 2021. [name] reports having a mild case of COVID in July 2022.  She also fell back in April 2022 and reports receiving back injections.  he was hospitalized from April 14, 2021 until April 28, 2021 at [facility] for rectal bleeding and abdominal discomfort.  Her chart indicates that colonoscopy was performed and a 6 mm polyp from the ascending colon was cauterized.  There was mild mucosal inflammatory change involving the distal transverse colon suggestive of resolving ischemic colitis.  Her symptoms resolved and she was discharged home.       Vitals:    10/10/22 0821   BP: 118/67  BP Source: Arm, Left Upper   Pulse: 71   SpO2: 96%   O2 Device: None (Room air)   PainSc: Six   Weight: 59.8 kg (131 lb 12.8 oz)   Height: 162.6 cm (5' 4)     Body mass index is 22.62 kg/m?Marland Kitchen     Past Medical History  Patient Active Problem List    Diagnosis Date Noted    Essential hypertension 01/12/2017     05/27/2018 ECHO DOPPLER:    Left Ventricle: Normal size and wall thickness. Concentric remodeling. Normal ejection fraction with LVEF=65%. No segmental wall motion abnormalities.  Right Ventricle: Normal size, wall thickness and ejection fraction.  Normal biatrial size.  There is no no significant valve disease.  Estimated Peak Systolic PA Pressure 27 mmHg  No pericardial effusion.  No prior for comparison.      DM2 (diabetes mellitus, type 2) (HCC) 10/30/2012     on metformin      Sinus problem 10/30/2012    HLD (hyperlipidemia) 10/30/2012    GERD (gastroesophageal reflux disease) 10/30/2012    Breast mass 10/25/2012     DIAGNOSIS:  Left breast mass on outside imaging     HISTORY:  [name] is a Caucasian female who presented to the [facility] on 10/30/2012 at age 40 for evaluation of a left breast mass on outside imaging.   BREAST IMAGING:  Mammogram:  Bilateral screening mammogram 06/12/12 [facility]) revealed a 6 mm mass in the lateral left breast. There was an oil cyst adjacent to the mass. No abnormalities in the right breast. 6 month follow up left mammogram was recommended. Left diagnostic mammogram 10/17/12 [facility]) revealed a 7 mm density in the lateral breast which had in size from May 2014. There was an oil cyst anterior to the lesion.  Ultrasound:  Left breast ultrasound 10/17/12 [facility]) revealed an oil cyst between 2-4:00. There was no correlation to mammogram finding.    REPRODUCTIVE HEALTH:  Age at first Menarche:  2  Age at First Live Birth: 14   Age at Menopause:  unknown, had hysterectomy, still has ovaries, unsure what year  Gravida:  3  Para: 3  Breastfeeding:  N/A    PERTINENT PMH:  Sinus trouble, DM2, hyperlipidemia, GERD, HTN  FAMILY HISTORY:  No family history of breast, ovarian, prostate or pancreatic cancer  PHYSICAL EXAM on PRESENTATION:  Right - No palpable breast masses. No skin, nipple, or areolar change. Left - No palpable breast masses. No skin, nipple, or areolar change. No supraclavicular or axillary adenopathy.  REFERRED BY:  Dr. Steva Ready             Review of Systems   Constitutional: Negative.   HENT: Negative.     Eyes: Negative.    Cardiovascular: Negative.    Respiratory: Negative.     Endocrine: Negative.    Hematologic/Lymphatic: Negative.    Skin: Negative.    Musculoskeletal: Negative.    Gastrointestinal: Negative.    Genitourinary: Negative.    Neurological: Negative.    Psychiatric/Behavioral: Negative.     Allergic/Immunologic: Negative.        Physical Exam  GENERAL: The patient is well developed, well nourished, resting comfortably and in no distress.   HEENT: No abnormalities of the visible oro-nasopharynx, conjunctiva or sclera are noted.  NECK: There is no jugular venous distension. Carotids are palpable and without bruits. There is no thyroid enlargement.  Chest: Lung fields are clear to auscultation. There are no wheezes or  crackles.  CV: There is a regular rhythm. The first and second heart sounds are normal. There are no murmurs, gallops or rubs.  Her apical heart rate is 72 bpm.  ABD: The abdomen is soft and supple with normal bowel sounds. There is no hepatosplenomegaly, ascites, tenderness, masses or bruits.  Neuro: There are no focal motor defects. Ambulation is normal. Cognitive function appears normal.  Ext: Trace bipedal edema is noted without evidence of deep vein thrombosis. Peripheral pulses are satisfactory.    SKIN: There are no rashes and no cellulitis  PSYCH: The patient is calm, rationale and oriented.    Cardiovascular Studies  A twelve-lead ECG was obtained on 10/10/2022 reveals normal sinus rhythm with a heart rate of 71 bpm.  Mild nondiagnostic ST-T wave abnormalities are seen.  Echo Doppler 05/27/2018:   Left Ventricle: Normal size and wall thickness. Concentric remodeling. Normal ejection fraction with LVEF=65%. No segmental wall motion abnormalities.  Right Ventricle: Normal size, wall thickness and ejection fraction.  Normal biatrial size.  There is no no significant valve disease.  Estimated Peak Systolic PA Pressure 27 mmHg  No pericardial effusion.  No prior for comparison.     Regadenoson thallium stress test 05/27/2018:  Scintigraphic (planar/tomographic):   There are no perfusion defects.  All myocardial segments appear viable. Polar coordinate map identifies no perfusion abnormalities. TID Ratio:  1.11  (normal <1.36). Summed Stress Score:  0   , Summed Rest Score:  0. Regional Wall Thickening and Motion Post Stress:   There is normal left ventricular wall motion and thickening of all myocardial segments. Left Ventricular Ejection Fraction (post stress, in the resting state) =  83 %.  Left Ventricular End Diastolic Volume: 36 mL  SUMMARY/OPINION:  This study is normal with no evidence of significant myocardial ischemia.  The overall calculated left ventricular systolic function is normal.  There is normal regional wall motion and thickening in all segments.  There are no high risk prognostic indicators present.  The pharmacologic ECG portion of the study is negative for ischemia. Comparison was made to a prior study performed on February 02, 2017, also on the ADAC camera.  That study demonstrated a small fixed apical defect which was felt to be secondary to soft tissue attenuation.  The calculated EF was 64% with an end-diastolic volume of 44 mL.  Comparing the 2 studies qualitatively, there has been no significant interval changes. In aggregate the current study is low risk in regards to predicted annual cardiovascular mortality rate.    An outside abdomen of the MRI dated 09/26/2022 indicated: Infrarenal abdominal aortic aneurysm measuring 3.5 cm.  Cardiovascular Health Factors  Vitals BP Readings from Last 3 Encounters:   10/10/22 118/67   07/12/21 138/74   02/01/21 (!) 140/80     Wt Readings from Last 3 Encounters:   10/10/22 59.8 kg (131 lb 12.8 oz)   07/12/21 65 kg (143 lb 6.4 oz)   02/01/21 65.8 kg (145 lb)     BMI Readings from Last 3 Encounters:   10/10/22 22.62 kg/m?   07/12/21 25.40 kg/m?   02/01/21 25.69 kg/m?      Smoking Social History     Tobacco Use   Smoking Status Former    Current packs/day: 0.00    Average packs/day: 1 pack/day for 15.0 years (15.0 ttl pk-yrs)    Types: Cigarettes    Start date: 10/30/2001    Quit date: 10/30/2016    Years since quitting: 5.9   Smokeless  Tobacco Never      Lipid Profile Cholesterol   Date Value Ref Range Status   02/22/2022 139  Final     HDL   Date Value Ref Range Status   02/22/2022 37 (L) >=40 Final     LDL   Date Value Ref Range Status   02/22/2022 84  Final     Triglycerides   Date Value Ref Range Status   02/22/2022 90  Final      Blood Sugar Hemoglobin A1C   Date Value Ref Range Status   07/06/2022 6.0  Final     Glucose   Date Value Ref Range Status   07/03/2022 86  Final   07/01/2022 101  Final   04/22/2021 136 (H) 70 - 105 Final          Problems Addressed Today  Encounter Diagnoses   Name Primary?    Screening for heart disease Yes       Assessment and Plan   [name] appears stable from a cardiovascular perspective. She reports no angina or congestive symptoms and her blood pressure currently is well controlled, when checked outside the office.  [name] reports no flares of bronchitis or COPD in recent months.  Evidently she smoked cigarettes from 2003 until 2018, averaging a pack per day.  Regular mild aerobic exercise and adherence to a heart healthy diet were recommended. I have asked the patient to keep a log book of her BP readings and to report BP readings exceeding 130/80 mm Hg.    She does not want to consider switching her statin therapy to a stronger statin or adding Repatha.  She plans to work through her primary care office for serial monitoring of her abdominal aortic aneurysm.  I have asked her to return for follow-up in 12 months. The total time spent during this interview and exam was 30 minutes.                   Current Medications (including today's revisions)   acetaminophen (TYLENOL) 500 mg tablet Take one tablet by mouth every 4 hours as needed for Pain. Max of 4,000 mg of acetaminophen in 24 hours.    albuterol (PROAIR HFA, VENTOLIN HFA, OR PROVENTIL HFA) 90 mcg/actuation inhaler Inhale two puffs by mouth into the lungs four times daily as needed for Wheezing or Shortness of Breath. Shake well before use.    amLODIPine (NORVASC) 10 mg tablet Take one tablet by mouth daily.    calcium carbonate (TUMS PO) Take 2 tablets by mouth as Needed.    CHOLEcalciferoL (vitamin D3) (VITAMIN D3) 50 mcg (2,000 unit) tablet Take one tablet by mouth daily.    escitalopram oxalate (LEXAPRO) 20 mg tablet Take one tablet by mouth daily.    ezetimibe (ZETIA) 10 mg tablet Take one tablet by mouth daily. (Patient taking differently: Take one tablet by mouth daily. ON HOLD)    fexofenadine (ALLEGRA) 180 mg tablet Take one tablet by mouth daily.    fluticasone (FLONASE) 50 mcg/actuation nasal spray Apply two sprays to each nostril as directed as Needed.    L.acid/L.casei/B.bif/B.lon/FOS (PROBIOTIC BLEND PO) Take 1 capsule by mouth daily.    losartan(+) (COZAAR) 100 mg tablet Take one tablet by mouth daily.    metoclopramide (REGLAN) 10 mg tablet Take one tablet by mouth as Needed for Nausea.    omeprazole DR (PRILOSEC) 40 mg capsule Take one capsule by mouth twice daily.    potassium chloride  SR (K-DUR) 20 mEq tablet Take one tablet by mouth daily. Patient takes 1 tablet daily and takes 2 tablets daily on days she takes Torsemide    pravastatin (PRAVACHOL) 80 mg tablet TAKE 1 TABLET BY MOUTH ONCE DAILY AT BEDTIME    romosozumab-aqqg (EVENITY) 210mg /2.67mL ( 105mg /1.8mLx2) syringe Inject 2.34 mL under the skin every 28 days.    sennosides/docusate sodium (SENNA PLUS PO) Take 2 tablets by mouth at bedtime daily.    sucralfate (CARAFATE) 1 gram tablet Take one tablet by mouth as Needed. Take on an empty stomach.    SYMBICORT 160-4.5 mcg/actuation inhalation INHALE 2 PUFFS BY MOUTH TWICE DAILY IN THE MORNING AND IN THE EVENING RINSE MOUTH AND THROAT AFTER USE    torsemide (DEMADEX) 20 mg tablet Take one tablet by mouth as Needed.    zolpidem (AMBIEN) 10 mg tablet Take one tablet by mouth at bedtime as needed.

## 2022-11-13 ENCOUNTER — Encounter: Admit: 2022-11-13 | Discharge: 2022-11-13 | Payer: MEDICARE

## 2022-11-13 MED ORDER — PRAVASTATIN 80 MG PO TAB
80 mg | ORAL_TABLET | Freq: Every evening | ORAL | 3 refills | 90.00000 days | Status: AC
Start: 2022-11-13 — End: ?

## 2023-01-04 ENCOUNTER — Encounter: Admit: 2023-01-04 | Discharge: 2023-01-04 | Payer: MEDICARE

## 2023-01-04 NOTE — Telephone Encounter
Pt called requesting an appointment with Dr. Arna Medici.  Haley [name] states that she was in the hospital at [facility] last week after having a stroke.  She states that she did not notice her stroke symptoms at first, and believes the stroke happened last Wednesday, but did not go to the hospital until Friday.  She states she is doing okay.    Scheduled pt with Dr. Arna Medici in [place] with first available.    Haley [name] states that while she was there, they started her on aspirin 81mg  and clopidigrel 75mg  daily.  She states that she was not sure if she needed to continue the clopidigrel and wanted to know if Dr. Arna Medici would refill it if she needed to continue the medication.    Will route to Dr. Arna Medici for  review and recommendations.

## 2023-01-22 ENCOUNTER — Encounter: Admit: 2023-01-22 | Discharge: 2023-01-22 | Payer: MEDICARE

## 2023-02-13 ENCOUNTER — Encounter: Admit: 2023-02-13 | Discharge: 2023-02-13 | Payer: MEDICARE

## 2023-02-13 ENCOUNTER — Ambulatory Visit: Admit: 2023-02-13 | Discharge: 2023-02-14 | Payer: MEDICARE

## 2023-02-13 ENCOUNTER — Ambulatory Visit: Admit: 2023-02-13 | Discharge: 2023-02-13 | Payer: MEDICARE

## 2023-02-13 DIAGNOSIS — J349 Unspecified disorder of nose and nasal sinuses: Secondary | ICD-10-CM

## 2023-02-13 DIAGNOSIS — I10 Essential (primary) hypertension: Secondary | ICD-10-CM

## 2023-02-13 DIAGNOSIS — I4891 Unspecified atrial fibrillation: Secondary | ICD-10-CM

## 2023-02-13 MED ORDER — RIVAROXABAN 15 MG PO TAB
15 mg | Freq: Every day | ORAL | 0 refills | 30.00000 days | Status: AC
Start: 2023-02-13 — End: ?

## 2023-02-13 NOTE — Progress Notes
To our valued patient,     We have enrolled your heart monitor and requested it be sent to your home.  You should receive this within 2-3 business days. Please wear the monitor for 14 days. When you have completed the study, please remove the device, and mail it back to the company. Please call [employer] Customer Service at [phone] with questions about placement, troubleshooting, and insurance coverage. You can reach the [facility] ambulatory heart monitor team at [phone].      Your Heart Rhythm Management Team  Cardiovascular Medicine Department at [facility]              Ambulatory (External) Cardiac Monitor Enrollment Record     Placement Location: Home Enrollment  Vendor: [employer] ([employer])  Mobile Cardiac Telemetry (MCOT/MCT)?: No  Duration of Monitor (in days): 14  Monitor Diagnosis: Atrial Fibrillation (I48.91)  Secondary Monitor Diagnosis: Hypertension (I10)  Ordering Provider: Hester Mates, MD  AMB Monitor Serial Number: Home Enrollment  No data recorded    Start Time and Date: 02/13/23 5:44 PM   Patient Name: Haley [name]  DOB: [birth year]  MRN: [id number]  Sex: female  Mobile Phone Number: [phone] (mobile)  Home Phone Number: [phone]  Patient Address: [address] [place] [address]  Insurance Coverage: MEDICARE PART A AND B  Insurance ID: [id number]  Insurance Group #:   Insurance Subscriber: [name],Haley L  Implanted Cardiac Device Information: No results found for: [id number]      Patient instructed to contact company phone number on the monitor box with questions regarding billing, placement, troubleshooting.     [name]    ____________________________________________________________    Clinic Staff:    Complete additional steps for documentation double check/Co-Sign.  In Follow-up, send chart upon closing encounter to [facility] AMBULATORY MONITORS    [facility] Ambulatory Monitoring Team:  Schedule on appropriate template and check-in.   Clinic Placement Schedule on clinic location [facility] schedule   Home Enrollment Schedule on Home Enrollment schedule ([facility])   Given to patient in clinic for self-placement Schedule on Home Enrollment schedule ([facility])   Inpatient Schedule on [place] [facility] AMBULATORY MONITORING template   2. Please enroll with appropriate vendor.

## 2023-02-13 NOTE — Progress Notes
Date of Service: 02/13/2023    Haley [name] is a 78 y.o. female.       HPI   Haley [name] has been followed for chronic obstructive pulmonary disease, hypertension and hypercholesterolemia.  She awoke with right-sided weakness on 12/29/2022.  She was hospitalized at an outside hospital.  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  The discharge note indicates that neurology was consulted and recommended dual antiplatelet therapy with aspirin and Plavix for 30 days followed by aspirin indefinitely.  An event monitor was obtained and the event monitor was signed on 01/30/2023.  It reported sinus rhythm, nonsustained ventricular tachycardia, and an episode of irregularly irregular wide-complex tachycardia possibly atrial fibrillation with aberrancy.  The patient was then contacted and placed on Xarelto 20 mg daily which she has been taking without difficulty.  The patient indicates that she has gone through physical therapy and has had good recovery.  She believes that the weakness in her right upper and lower extremity has improved 85%.  Haley [name] reports that her blood pressure has been well controlled over the past month.  Otherwise, the patient indicates that she has been stable and reports no angina, congestive symptoms, palpitations, sensation of sustained forceful heart pounding, lightheadedness or syncope.  The patient reports no myalgias, claudication, or bleeding abnormalities. She has been taking pravastatin 80 mg once a day and has been tolerating this medication without adverse effects.  Previously she has developed myalgias on other statin medications.  She was placed on Zetia and did not tolerate this because she says it gave her myalgias.  Historically, Haley [name] has previously smoked cigarettes and smoked for 15-20 years, averaging three quarters of a pack per day.  She stopped smoking in 2017.  Haley [name] has recurrent episodes of bronchitis, and will occasionally have several episodes a year.  The patient reports that she underwent colonoscopy in the spring 2019 and that 10 polyps were found but no cancer.  She also underwent left hernia repair in April 2019 without complications. Haley [name] reports no prior history of coronary artery disease, myocardial infarction, congestive heart failure, transient ischemic attack, stroke, or symptomatic peripheral arterial disease.  She did obtain an echo Doppler study on November 30, 2016 because of nocturnal hypoxia.  This suggested an echodensity with a fluid collection adjacent to the right atrium and led to a CT scan scan of the thorax.  The CT scan of the thorax showed a moderate amount of calcified plaque within the aortic arch as well as focal calcified plaque within the proximal left anterior descending coronary artery.  This led to obtaining a pharmacologic stress test which was low risk for coronary events.In April 2020 she developed an upper respiratory tract infection which improved with antibiotics and prednisone.  She was hospitalized for 3 days in March 2021 with diverticulitis and received antibiotics.  Afterwards she developed C. difficile required treatment with vancomycin.  She was treated for urinary tract infection with Cipro in June 2021. Haley [name] reports having a mild case of COVID in July 2022.  She also fell back in April 2022 and reports receiving back injections.  he was hospitalized from April 14, 2021 until April 28, 2021 at [facility] for rectal bleeding and abdominal discomfort.  Her chart indicates that colonoscopy was performed and a 6 mm polyp from the ascending colon was cauterized.  There was mild mucosal inflammatory change involving the distal transverse colon suggestive of  resolving ischemic colitis.  Her symptoms resolved and she was discharged home. Haley [name] indicates that she was hospitalized on 06/30/2022 for left abdominal discomfort.  She was hospitalized for several days and believes that she was treated with antibiotics.  She believes that perhaps she had diverticulitis.       Vitals:    02/13/23 1340   BP: 132/67   BP Source: Arm, Left Upper   Pulse: 82   SpO2: 97%   O2 Device: None (Room air)   PainSc: Zero   Weight: 59.1 kg (130 lb 6.4 oz)   Height: 162.6 cm (5' 4)     Body mass index is 22.38 kg/m?Marland Kitchen     Past Medical History  Patient Active Problem List    Diagnosis Date Noted    Essential hypertension 01/12/2017     05/27/2018 ECHO DOPPLER:    Left Ventricle: Normal size and wall thickness. Concentric remodeling. Normal ejection fraction with LVEF=65%. No segmental wall motion abnormalities.  Right Ventricle: Normal size, wall thickness and ejection fraction.  Normal biatrial size.  There is no no significant valve disease.  Estimated Peak Systolic PA Pressure 27 mmHg  No pericardial effusion.  No prior for comparison.      DM2 (diabetes mellitus, type 2) (HCC) 10/30/2012     on metformin      Sinus problem 10/30/2012    HLD (hyperlipidemia) 10/30/2012    GERD (gastroesophageal reflux disease) 10/30/2012    Breast mass 10/25/2012     DIAGNOSIS:  Left breast mass on outside imaging     HISTORY:  Haley [name] is a Caucasian female who presented to the [facility] on 10/30/2012 at age 7 for evaluation of a left breast mass on outside imaging.   BREAST IMAGING:  Mammogram:  Bilateral screening mammogram 06/12/12 [facility]) revealed a 6 mm mass in the lateral left breast. There was an oil cyst adjacent to the mass. No abnormalities in the right breast. 6 month follow up left mammogram was recommended. Left diagnostic mammogram 10/17/12 [facility]) revealed a 7 mm density in the lateral breast which had in size from May 2014. There was an oil cyst anterior to the lesion.  Ultrasound:  Left breast ultrasound 10/17/12 [facility]) revealed an oil cyst between 2-4:00. There was no correlation to mammogram finding.    REPRODUCTIVE HEALTH:  Age at first Menarche:  21  Age at First Live Birth: 30   Age at Menopause:  unknown, had hysterectomy, still has ovaries, unsure what year  Gravida:  3  Para: 3  Breastfeeding:  N/A    PERTINENT PMH:  Sinus trouble, DM2, hyperlipidemia, GERD, HTN  FAMILY HISTORY:  No family history of breast, ovarian, prostate or pancreatic cancer  PHYSICAL EXAM on PRESENTATION:  Right - No palpable breast masses. No skin, nipple, or areolar change. Left - No palpable breast masses. No skin, nipple, or areolar change. No supraclavicular or axillary adenopathy.  REFERRED BY:  Dr. Steva Ready             Review of Systems   Constitutional: Positive for malaise/fatigue.   HENT: Negative.     Eyes: Negative.    Cardiovascular: Negative.    Respiratory:  Positive for shortness of breath.    Endocrine: Negative.    Hematologic/Lymphatic: Negative.    Skin: Negative.    Musculoskeletal: Negative.    Gastrointestinal: Negative.    Genitourinary: Negative.    Neurological: Negative.    Psychiatric/Behavioral: Negative.  Allergic/Immunologic: Negative.        Physical Exam  GENERAL: The patient is well developed, well nourished, resting comfortably and in no distress.   HEENT: No abnormalities of the visible oro-nasopharynx, conjunctiva or sclera are noted.  NECK: There is no jugular venous distension. Carotids are palpable and without bruits. There is no thyroid enlargement.  Chest: Lung fields are clear to auscultation. There are no wheezes or crackles.  CV: There is a regular rhythm. The first and second heart sounds are normal. There are no murmurs, gallops or rubs.  Her apical heart rate is 76 bpm.  ABD: The abdomen is soft and supple with normal bowel sounds. There is no hepatosplenomegaly, ascites, tenderness, masses or bruits.  Neuro: There are no focal motor defects.  Fine motor movements in the right hand look good.  Ambulation is normal. Cognitive function appears normal.  Ext: Trace bipedal edema is noted without evidence of deep vein thrombosis. Peripheral pulses are satisfactory.    SKIN: There are no rashes and no cellulitis  PSYCH: The patient is calm, rationale and oriented.    Cardiovascular Studies  A twelve-lead ECG from December 29, 2022 revealed normal sinus rhythm with a heart rate of 79 bpm.  Right axis deviation was noted.  An outside echocardiogram dated 12/29/2022 revealed: 1) findings consistent with concentric hypertrophy.  Left ventricular size is normal.  Normal wall motion.  Normal left ventricular systolic function.  Ejection fraction by Simpson's biplane = 59%.  Grade 1 diastolic dysfunction.  2) the right ventricle is normal in size.  3) left atrial volume and right atrial size are normal.  4) no Doppler evidence of hemodynamically significant valve dysfunction.  5) mildly calcified aortic valve with mild mitral valve regurgitation and no stenosis.  6) no significant pericardial effusion.    Cardiovascular Health Factors  Vitals BP Readings from Last 3 Encounters:   02/13/23 132/67   10/10/22 118/67   07/12/21 138/74     Wt Readings from Last 3 Encounters:   02/13/23 59.1 kg (130 lb 6.4 oz)   10/10/22 59.8 kg (131 lb 12.8 oz)   07/12/21 65 kg (143 lb 6.4 oz)     BMI Readings from Last 3 Encounters:   02/13/23 22.38 kg/m?   10/10/22 22.62 kg/m?   07/12/21 25.40 kg/m?      Smoking Social History     Tobacco Use   Smoking Status Former    Current packs/day: 0.00    Average packs/day: 1 pack/day for 15.0 years (15.0 ttl pk-yrs)    Types: Cigarettes    Start date: 10/30/2001    Quit date: 10/30/2016    Years since quitting: 6.2   Smokeless Tobacco Never      Lipid Profile Cholesterol   Date Value Ref Range Status   02/22/2022 139  Final     HDL   Date Value Ref Range Status   02/22/2022 37 (L) >=40 Final     LDL   Date Value Ref Range Status   02/22/2022 84  Final     Triglycerides   Date Value Ref Range Status   02/22/2022 90  Final      Blood Sugar Hemoglobin A1C   Date Value Ref Range Status   07/06/2022 6.0  Final     Glucose   Date Value Ref Range Status   07/03/2022 86  Final   07/01/2022 101  Final   04/22/2021 136 (H) 70 - 105 Final  Problems Addressed Today  Encounter Diagnoses   Name Primary?    Essential hypertension Yes    Sinus problem     Atrial fibrillation, unspecified type [facility])        Assessment and Plan     Haley [name] reports that she had a stroke on December 29, 2022 and has been placed on rivaroxaban 20 mg daily.  I reviewed as much of her event monitor as I could find.  A lot of artifact is noted and it is difficult to assess her burden of atrial fibrillation and discriminate atrial fibrillation from artifact.  I have asked her to wear another event monitor for 14 days.  Based upon her age, weight and serum creatinine her estimated creatinine clearance is in the range of 40 mL/min.  The appropriate dose of rivaroxaban for stroke prevention in atrial fibrillation for a patient of her age, weight and serum creatinine would be 15 mg daily.  Her primary care physician has been contacted with this dosage information. The risks and benefits of anticoagulation therapy have been reviewed with the patient. The patient understands that anticoagulation is used to decrease thrombotic or clotting complications associated with atrial fibrillation/flutter, such as stroke and systemic embolization which can be disabling or fatal, but can, on occasion, lead to life-threatening bleeding complications including gastrointestinal and intracranial hemorrhage. The patient wishes to continue anticoagulation with rivaroxaban at the present time.  I have asked the patient to keep a log book of her BP readings and to report BP readings exceeding 130/80 mm Hg. I have asked her to return for follow-up in 3 months time. The total time spent during this interview and exam with preparation and chart review was 45 minutes.         Current Medications (including today's revisions)   acetaminophen (TYLENOL) 500 mg tablet Take one tablet by mouth every 4 hours as needed for Pain. Max of 4,000 mg of acetaminophen in 24 hours.    albuterol (PROAIR HFA, VENTOLIN HFA, OR PROVENTIL HFA) 90 mcg/actuation inhaler Inhale two puffs by mouth into the lungs four times daily as needed for Wheezing or Shortness of Breath. Shake well before use.    amLODIPine (NORVASC) 10 mg tablet Take one tablet by mouth daily.    calcium carbonate (TUMS PO) Take 2 tablets by mouth as Needed.    CHOLEcalciferoL (vitamin D3) (VITAMIN D3) 50 mcg (2,000 unit) tablet Take one tablet by mouth daily.    escitalopram oxalate (LEXAPRO) 20 mg tablet Take one tablet by mouth daily.    ezetimibe (ZETIA) 10 mg tablet Take one tablet by mouth daily. (Patient taking differently: Take one tablet by mouth daily. ON HOLD)    fexofenadine (ALLEGRA) 180 mg tablet Take one tablet by mouth daily.    fluticasone (FLONASE) 50 mcg/actuation nasal spray Apply two sprays to each nostril as directed as Needed.    L.acid/L.casei/B.bif/B.lon/FOS (PROBIOTIC BLEND PO) Take 1 capsule by mouth daily.    losartan(+) (COZAAR) 100 mg tablet Take one tablet by mouth daily.    metoclopramide (REGLAN) 10 mg tablet Take one tablet by mouth as Needed for Nausea.    omeprazole DR (PRILOSEC) 40 mg capsule Take one capsule by mouth twice daily.    potassium chloride SR (K-DUR) 20 mEq tablet Take one tablet by mouth daily. Patient takes 1 tablet daily and takes 2 tablets daily on days she takes Torsemide    pravastatin (PRAVACHOL) 80 mg tablet TAKE 1 TABLET BY MOUTH ONCE DAILY AT  BEDTIME    rivaroxaban (XARELTO) 15 mg tablet Take one tablet by mouth daily with dinner.    romosozumab-aqqg (EVENITY) 210mg /2.83mL ( 105mg /1.34mLx2) syringe Inject 2.34 mL under the skin every 28 days.    sennosides/docusate sodium (SENNA PLUS PO) Take 2 tablets by mouth at bedtime daily.    sucralfate (CARAFATE) 1 gram tablet Take one tablet by mouth as Needed. Take on an empty stomach.    SYMBICORT 160-4.5 mcg/actuation inhalation INHALE 2 PUFFS BY MOUTH TWICE DAILY IN THE MORNING AND IN THE EVENING RINSE MOUTH AND THROAT AFTER USE    torsemide (DEMADEX) 20 mg tablet Take one tablet by mouth as Needed.    zolpidem (AMBIEN) 10 mg tablet Take one tablet by mouth at bedtime as needed.

## 2023-03-07 ENCOUNTER — Encounter: Admit: 2023-03-07 | Discharge: 2023-03-07 | Payer: MEDICARE

## 2023-03-15 ENCOUNTER — Encounter: Admit: 2023-03-15 | Discharge: 2023-03-15 | Payer: MEDICARE

## 2023-03-15 NOTE — Telephone Encounter
-----   Message from Oakley L sent at 03/15/2023 12:21 PM CST -----    ----- Message -----  From: Valora Piccolo, RN  Sent: 03/15/2023  11:46 AM CST  To: [facility] Nurse Gen Card Team [facility]      ----- Message -----  From: Hester Mates, MD  Sent: 03/15/2023  11:42 AM CST  To: Agapito Games, RN; Willene Hatchet, RN    No worrisome arrhythmias seen on the event monitor.  Please let her know.  Thanks.  SBG

## 2023-03-15 NOTE — Telephone Encounter
Results and recommendations called to patient.

## 2023-04-24 ENCOUNTER — Encounter: Admit: 2023-04-24 | Discharge: 2023-04-24 | Payer: MEDICARE

## 2023-05-01 ENCOUNTER — Ambulatory Visit: Admit: 2023-05-01 | Discharge: 2023-05-02

## 2023-05-01 ENCOUNTER — Encounter: Admit: 2023-05-01 | Discharge: 2023-05-01

## 2023-05-01 DIAGNOSIS — J349 Unspecified disorder of nose and nasal sinuses: Secondary | ICD-10-CM

## 2023-05-01 DIAGNOSIS — I4891 Unspecified atrial fibrillation: Secondary | ICD-10-CM

## 2023-05-01 DIAGNOSIS — I10 Essential (primary) hypertension: Secondary | ICD-10-CM

## 2023-05-01 NOTE — Progress Notes
 Date of Service: 05/01/2023    Haley [name] is a 78 y.o. female.       HPI   [name] has been followed for chronic obstructive pulmonary disease, hypertension and hypercholesterolemia.  She awoke with right-sided weakness on 12/29/2022.  She was hospitalized at an outside hospital.  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  The discharge note indicates that neurology was consulted and recommended dual antiplatelet therapy with aspirin and Plavix for 30 days followed by aspirin indefinitely.  An event monitor was obtained and the event monitor was signed on 01/30/2023.  It reported sinus rhythm, nonsustained ventricular tachycardia, and an episode of irregularly irregular wide-complex tachycardia possibly atrial fibrillation with aberrancy.  The patient was then contacted and placed on Xarelto 20 mg daily.  Her dose was reduced to 15 mg daily based upon her estimated creatinine clearance.  The patient indicates that she has gone through physical therapy and has had good recovery.  She believes that the weakness in her right upper and lower extremity has improved to 90+%.  [name] reports that her blood pressure has been well controlled over the past 2 months.  I notice that she was seen in outside emergency room on April 15, 2023 for abdominal discomfort that she believes was attributed to back disease.  Otherwise, the patient indicates that she has been stable and reports no angina, congestive symptoms, palpitations, sensation of sustained forceful heart pounding, lightheadedness or syncope.  The patient reports no myalgias, claudication, or bleeding abnormalities. She has been taking pravastatin 80 mg once a day and has been tolerating this medication without adverse effects.  Previously she has developed myalgias on other statin medications.  She was placed on Zetia and did not tolerate this because she says it gave her myalgias.  Historically, [name] has previously smoked cigarettes and smoked for 15-20 years, averaging three quarters of a pack per day.  She indicates that she is smoking several cigarettes per day currently.  [name] has recurrent episodes of bronchitis, and will occasionally have several episodes a year.  The patient reports that she underwent colonoscopy in the spring 2019 and that 10 polyps were found but no cancer.  She also underwent left hernia repair in April 2019 without complications. [name] reports no prior history of coronary artery disease, myocardial infarction, congestive heart failure, transient ischemic attack, stroke, or symptomatic peripheral arterial disease.  She did obtain an echo Doppler study on November 30, 2016 because of nocturnal hypoxia.  This suggested an echodensity with a fluid collection adjacent to the right atrium and led to a CT scan scan of the thorax.  The CT scan of the thorax showed a moderate amount of calcified plaque within the aortic arch as well as focal calcified plaque within the proximal left anterior descending coronary artery.  This led to obtaining a pharmacologic stress test which was low risk for coronary events.In April 2020 she developed an upper respiratory tract infection which improved with antibiotics and prednisone.  She was hospitalized for 3 days in March 2021 with diverticulitis and received antibiotics.  Afterwards she developed C. difficile required treatment with vancomycin.  She was treated for urinary tract infection with Cipro in June 2021. [name] reports having a mild case of COVID in July 2022.  She also fell back in April 2022 and reports receiving back injections.  he was hospitalized from April 14, 2021 until April 28, 2021 at  [facility] for rectal bleeding and abdominal discomfort.  Her chart indicates that colonoscopy was performed and a 6 mm polyp from the ascending colon was cauterized.  There was mild mucosal inflammatory change involving the distal transverse colon suggestive of resolving ischemic colitis.  Her symptoms resolved and she was discharged home. [name] indicates that she was hospitalized on 06/30/2022 for left abdominal discomfort.  She was hospitalized for several days and believes that she was treated with antibiotics.  She believes that perhaps she had diverticulitis.       Vitals:    05/01/23 1054   BP: 112/68   BP Source: Arm, Left Upper   Pulse: 78   SpO2: 97%   O2 Device: None (Room air)   PainSc: Five   Weight: 55.7 kg (122 lb 12.8 oz)   Height: 162.6 cm (5' 4)     Body mass index is 21.08 kg/m?Marland Kitchen     Past Medical History  Patient Active Problem List    Diagnosis Date Noted    Essential hypertension 01/12/2017     05/27/2018 ECHO DOPPLER:    Left Ventricle: Normal size and wall thickness. Concentric remodeling. Normal ejection fraction with LVEF=65%. No segmental wall motion abnormalities.  Right Ventricle: Normal size, wall thickness and ejection fraction.  Normal biatrial size.  There is no no significant valve disease.  Estimated Peak Systolic PA Pressure 27 mmHg  No pericardial effusion.  No prior for comparison.      DM2 (diabetes mellitus, type 2) (CMS-HCC) 10/30/2012     on metformin      Sinus problem 10/30/2012    HLD (hyperlipidemia) 10/30/2012    GERD (gastroesophageal reflux disease) 10/30/2012    Breast mass 10/25/2012     DIAGNOSIS:  Left breast mass on outside imaging     HISTORY:  [name] is a Caucasian female who presented to the [facility] on 10/30/2012 at age 22 for evaluation of a left breast mass on outside imaging.   BREAST IMAGING:  Mammogram:  Bilateral screening mammogram 06/12/12 [facility]) revealed a 6 mm mass in the lateral left breast. There was an oil cyst adjacent to the mass. No abnormalities in the right breast. 6 month follow up left mammogram was recommended. Left diagnostic mammogram 10/17/12 [facility]) revealed a 7 mm density in the lateral breast which had in size from May 2014. There was an oil cyst anterior to the lesion.  Ultrasound:  Left breast ultrasound 10/17/12 [facility]) revealed an oil cyst between 2-4:00. There was no correlation to mammogram finding.    REPRODUCTIVE HEALTH:  Age at first Menarche:  67  Age at First Live Birth: 29   Age at Menopause:  unknown, had hysterectomy, still has ovaries, unsure what year  Gravida:  3  Para: 3  Breastfeeding:  N/A    PERTINENT PMH:  Sinus trouble, DM2, hyperlipidemia, GERD, HTN  FAMILY HISTORY:  No family history of breast, ovarian, prostate or pancreatic cancer  PHYSICAL EXAM on PRESENTATION:  Right - No palpable breast masses. No skin, nipple, or areolar change. Left - No palpable breast masses. No skin, nipple, or areolar change. No supraclavicular or axillary adenopathy.  REFERRED BY:  Dr. Steva Ready             Review of Systems   Constitutional: Positive for malaise/fatigue.   HENT: Negative.     Eyes: Negative.    Cardiovascular: Negative.    Respiratory: Negative.     Endocrine: Negative.  Hematologic/Lymphatic: Negative.    Skin: Negative.    Musculoskeletal:  Positive for back pain.   Gastrointestinal: Negative.    Genitourinary: Negative.    Neurological: Negative.    Psychiatric/Behavioral: Negative.     Allergic/Immunologic: Negative.        Physical Exam  GENERAL: The patient is well developed, well nourished, resting comfortably and in no distress.   HEENT: No abnormalities of the visible oro-nasopharynx, conjunctiva or sclera are noted.  NECK: There is no jugular venous distension. Carotids are palpable and without bruits. There is no thyroid enlargement.  Chest: Lung fields are clear to auscultation. There are no wheezes or crackles.  CV: There is a regular rhythm. The first and second heart sounds are normal. There are no murmurs, gallops or rubs.  Her apical heart rate is 76 bpm.  ABD: The abdomen is soft and supple with normal bowel sounds. There is no hepatosplenomegaly, ascites, tenderness, masses or bruits.  Neuro: There are no focal motor defects.  Fine motor movements in the right hand look good.  No noticeable motor defects.  Ambulation is normal. Cognitive function appears normal.  Ext: Trace bipedal edema is noted without evidence of deep vein thrombosis. Peripheral pulses are satisfactory.    SKIN: There are no rashes and no cellulitis  PSYCH: The patient is calm, rationale and oriented.    Cardiovascular Studies  A twelve-lead ECG from December 29, 2022 revealed normal sinus rhythm with a heart rate of 79 bpm.  Right axis deviation was noted.  An outside echocardiogram dated 12/29/2022 revealed: 1) findings consistent with concentric hypertrophy.  Left ventricular size is normal.  Normal wall motion.  Normal left ventricular systolic function.  Ejection fraction by Simpson's biplane = 59%.  Grade 1 diastolic dysfunction.  2) the right ventricle is normal in size.  3) left atrial volume and right atrial size are normal.  4) no Doppler evidence of hemodynamically significant valve dysfunction.  5) mildly calcified aortic valve with mild mitral valve regurgitation and no stenosis.  6) no significant pericardial effusion.    The outside event monitor from 01/30/2023 reported:    Sinus rhythm.     Nonsustained ventricular tachycardia.     Episode of irregularly irregular wide-complex tachycardia possibly   atrial fibrillation with aberrancy.     Patient monitored for 22d 6h 49m   48 events were transmitted. 0 patient triggered; 48 auto triggered   < 0.1% AF was found during the monitoring period VT occurred 1 time(s)   with fastest run 171 BPM PACs could not be calculated due to excessive   artifact 21,010 PVCs with PVC burden of 5%     Event monitor 02/13/2023:  Interpretation Summary  An event monitor was obtained to correlate symptoms with significant ectopy or arrhythmias.  The duration of the event monitor was 14 days and 0 hours with 8 days and 12 hours of analysis time.  The underlying rhythm was normal sinus rhythm with an average heart rate of 73 bpm.  The heart rate range was 53-118 bpm.  Infrequent isolated supraventricular ectopy was noted along with infrequent supraventricular ectopic pairs and triplets.  Occasional short runs of supraventricular tachycardia were noted with the fastest episode consisting of 10 beats with an average rate of 171 bpm which occurred at 11:37 AM on 02/27/2023.  The longest nonsustained supraventricular episode which occurred at 1:05 PM on 02/27/2023 lasted 20 seconds but had an average rate of only 92 bpm.  No sustained supraventricular tachycardia  was noted.  Infrequent isolated ventricular ectopy was seen along with rare paired ventricular ectopy.  No ventricular triplets were noted but there were 3 short runs of nonsustained ventricular tachycardia.  The longest episode consisted of 9 beats with an average rate of heart rate of 135 bpm noted at 9:06 AM on 03/03/2023 and the fastest episode consisted of 4 beats with an average heart rate of 153 bpm noted at 1:16 AM on 02/24/2023.  Infrequent episodes of ventricular bigeminy were seen with the longest episode lasting 5.1 seconds.  Infrequent episodes of ventricular trigeminy were seen with the longest episode lasting 14.5 seconds.  No sustained ventricular tachycardia was noted.  There were no significant pauses and no episodes of second or third-degree AV block were recorded.  There were no episodes of atrial fibrillation or atrial flutter seen.  There were no diary entries and no triggered events.    Cardiovascular Health Factors  Vitals BP Readings from Last 3 Encounters:   05/01/23 112/68   02/13/23 132/67   10/10/22 118/67     Wt Readings from Last 3 Encounters:   05/01/23 55.7 kg (122 lb 12.8 oz)   02/13/23 59.1 kg (130 lb 6.4 oz)   10/10/22 59.8 kg (131 lb 12.8 oz)     BMI Readings from Last 3 Encounters:   05/01/23 21.08 kg/m?   02/13/23 22.38 kg/m?   10/10/22 22.62 kg/m?      Smoking Social History     Tobacco Use   Smoking Status Former Current packs/day: 0.00    Average packs/day: 1 pack/day for 15.0 years (15.0 ttl pk-yrs)    Types: Cigarettes    Start date: 10/30/2001    Quit date: 10/30/2016    Years since quitting: 6.5   Smokeless Tobacco Never      Lipid Profile Cholesterol   Date Value Ref Range Status   12/30/2022 125  Final     HDL   Date Value Ref Range Status   12/30/2022 36 (L) >=50 Final     LDL   Date Value Ref Range Status   12/30/2022 62  Final     Triglycerides   Date Value Ref Range Status   12/30/2022 97  Final      Blood Sugar Hemoglobin A1C   Date Value Ref Range Status   07/06/2022 6.0  Final     Glucose   Date Value Ref Range Status   04/15/2023 189 (H) 74 - 106 Final   07/03/2022 86  Final   07/01/2022 101  Final          Problems Addressed Today  Encounter Diagnoses   Name Primary?    Essential hypertension     Sinus problem     Atrial fibrillation, unspecified type (CMS-HCC)        Assessment and Plan     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  Her dose has been decreased to 50 mg daily, which is appropriate for her estimated creatinine clearance.  When I calculated her estimated creatinine clearance today was 38 mL/min.  I have reviewed as much of her event monitor as I could find.  There was 1 episode timed at 9:25 AM on 01/02/2023 where her rhythm does appear to be atrial fibrillation with a rapid ventricular response rate, of approximately 210 bpm.  I could not find the estimated duration of the episode of atrial fibrillation.  The  appropriate dose of rivaroxaban for stroke prevention in atrial fibrillation for a patient of her age, weight and serum creatinine would be 15 mg daily.   The risks and benefits of anticoagulation therapy have been reviewed with the patient. The patient understands that anticoagulation is used to decrease thrombotic or clotting complications associated with atrial fibrillation/flutter, such as stroke and systemic embolization which can be disabling or fatal, but can, on occasion, lead to life-threatening bleeding complications including gastrointestinal and intracranial hemorrhage. The patient wishes to continue anticoagulation with rivaroxaban at the present time.  I have asked the patient to keep a log book of her BP readings and to report BP readings exceeding 130/80 mm Hg. I have asked her to return for follow-up in 6 months time.  Complete cessation from cigarette smoking was strongly recommended.  The total time spent during this interview and exam with preparation and chart review was 30 minutes.         Current Medications (including today's revisions)   acetaminophen (TYLENOL) 500 mg tablet Take one tablet by mouth every 4 hours as needed for Pain. Max of 4,000 mg of acetaminophen in 24 hours.    albuterol (PROAIR HFA, VENTOLIN HFA, OR PROVENTIL HFA) 90 mcg/actuation inhaler Inhale two puffs by mouth into the lungs four times daily as needed for Wheezing or Shortness of Breath. Shake well before use.    amLODIPine (NORVASC) 10 mg tablet Take one tablet by mouth daily.    calcium carbonate (TUMS PO) Take 2 tablets by mouth as Needed.    CHOLEcalciferoL (vitamin D3) (VITAMIN D3) 50 mcg (2,000 unit) tablet Take one tablet by mouth daily.    escitalopram oxalate (LEXAPRO) 20 mg tablet Take one tablet by mouth daily.    fexofenadine (ALLEGRA) 180 mg tablet Take one tablet by mouth as Needed.    fluticasone (FLONASE) 50 mcg/actuation nasal spray Apply two sprays to each nostril as directed as Needed.    L.acid/L.casei/B.bif/B.lon/FOS (PROBIOTIC BLEND PO) Take 1 capsule by mouth daily.    losartan(+) (COZAAR) 100 mg tablet Take one tablet by mouth daily.    metoclopramide (REGLAN) 10 mg tablet Take one tablet by mouth as Needed for Nausea.    omeprazole DR (PRILOSEC) 40 mg capsule Take one capsule by mouth twice daily.    potassium chloride SR (K-DUR) 20 mEq tablet Take one tablet by mouth daily. Patient takes 1 tablet daily and takes 2 tablets daily on days she takes Torsemide    pravastatin (PRAVACHOL) 80 mg tablet TAKE 1 TABLET BY MOUTH ONCE DAILY AT BEDTIME    rivaroxaban (XARELTO) 15 mg tablet Take one tablet by mouth daily with dinner.    sennosides/docusate sodium (SENNA PLUS PO) Take 2 tablets by mouth at bedtime daily.    sucralfate (CARAFATE) 1 gram tablet Take one tablet by mouth as Needed. Take on an empty stomach.    SYMBICORT 160-4.5 mcg/actuation inhalation Inhale two puffs by mouth into the lungs twice daily as needed.    torsemide (DEMADEX) 20 mg tablet Take one tablet by mouth as Needed.    zolpidem (AMBIEN) 10 mg tablet Take one tablet by mouth at bedtime as needed.

## 2023-05-01 NOTE — Patient Instructions
 Thank you for visiting our office today.    We would like to make the following medication adjustments:  NONE       Otherwise continue the same medications as you have been doing.          We will be pursuing the following tests after your appointment today:          Scheduling [phone]      We will plan to see you back in 6 months.  Please call us in the meantime with any questions or concerns.        Please allow 5-7 business days for our providers to review your results. All normal results will go to MyChart. If you do not have Mychart, it is strongly recommended to get this so you can easily view all your results. If you do not have mychart, we will attempt to call you once with normal lab and testing results. If we cannot reach you by phone with normal results, we will send you a letter.  If you have not heard the results of your testing after one week please give [place] a call.       Your Cardiovascular [facility] [place] Team Brett Canales, Pilar Jarvis, Shawna Orleans, and [place])  phone number is [phone].

## 2023-07-01 IMAGING — RF FL esophagus
5 series · 13 of 13 positions shown · non-contrast
Comparison: none

[Series 1: esophogr 1 · 2 of 2 frames shown]
[frame 1/2]
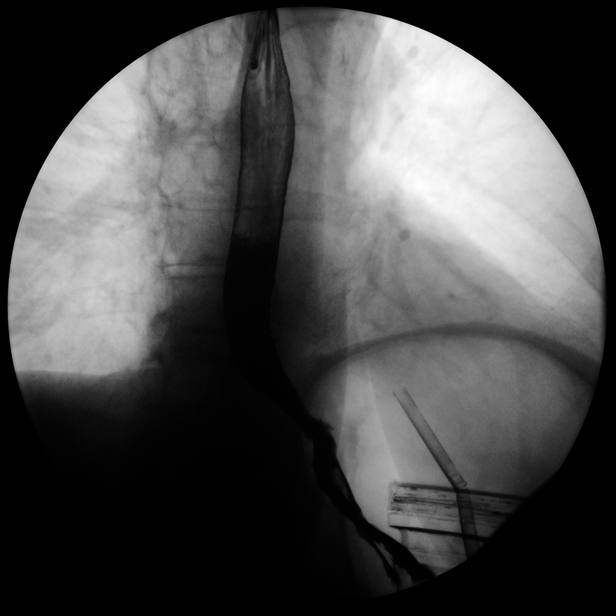
[frame 2/2]
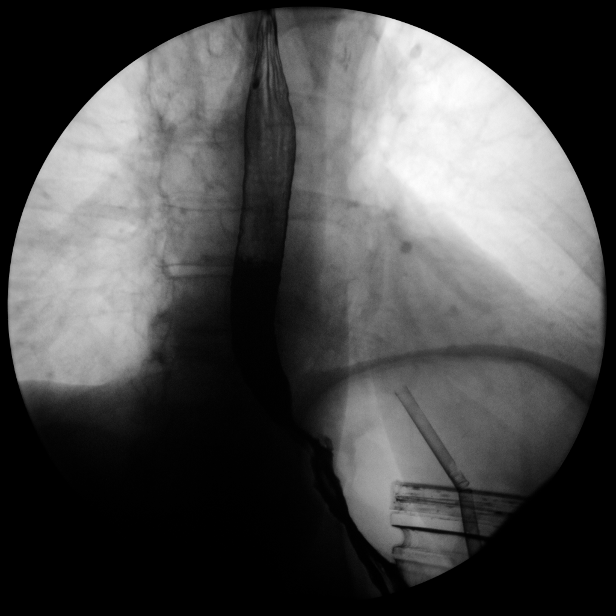

[Series 2: esophogr 2 · 2 acquisitions, 3 frames shown]
[im 1/2]
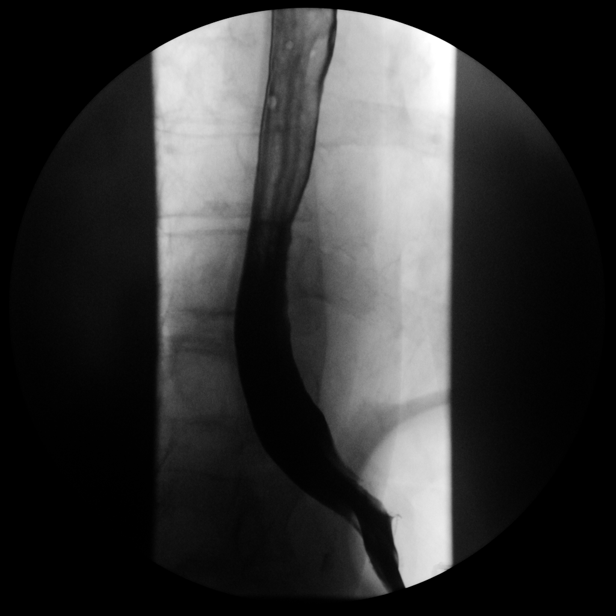
[im 2/2]
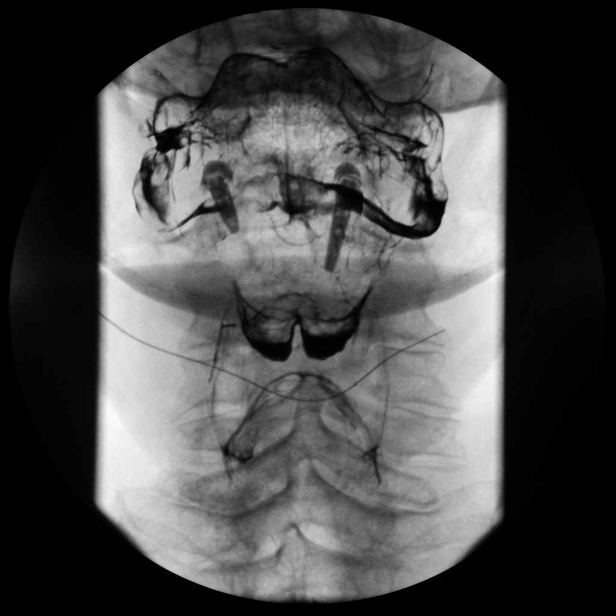
[im 2/2]
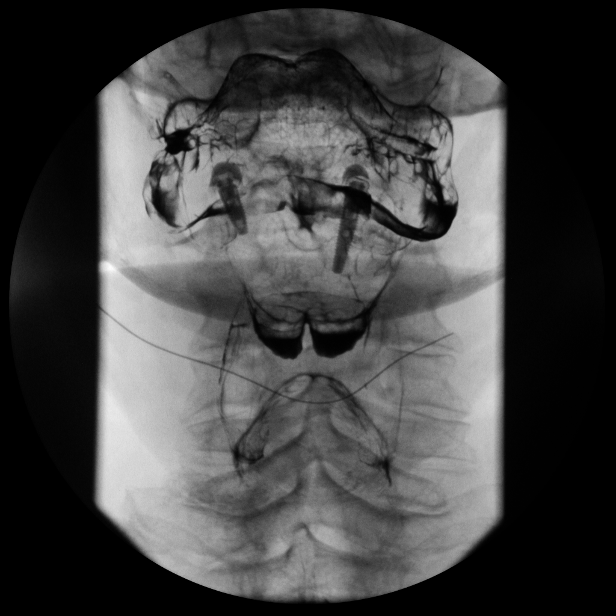

[Series 3: esophogr 3 · 3 acquisitions, 4 frames shown]
[im 1/3]
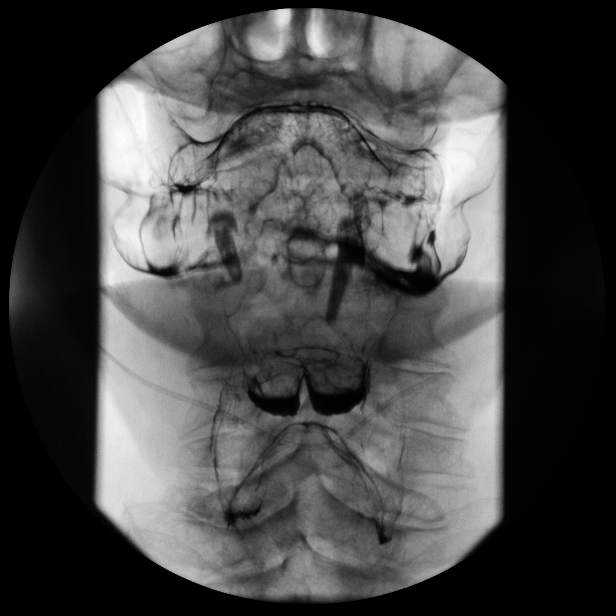
[im 2/3]
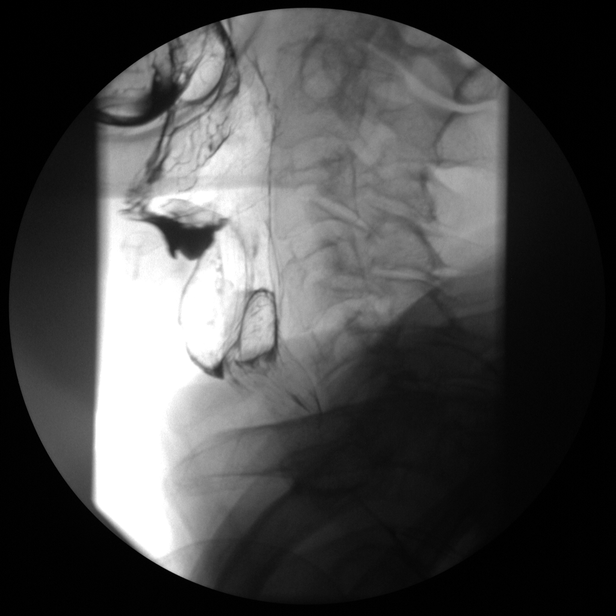
[im 3/3]
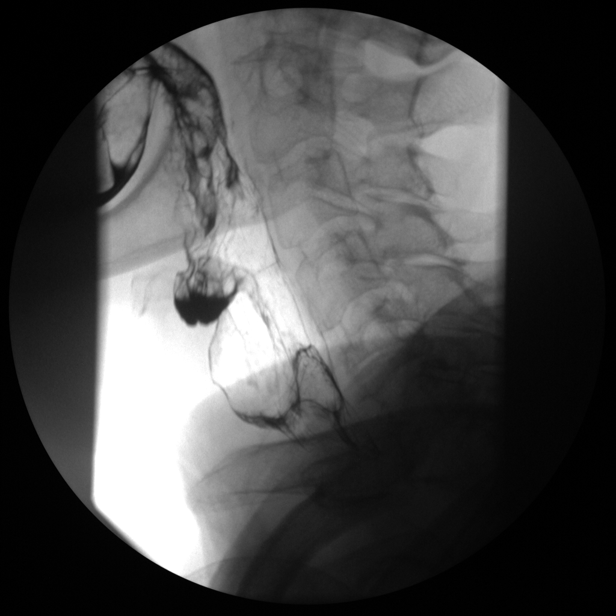
[im 3/3]
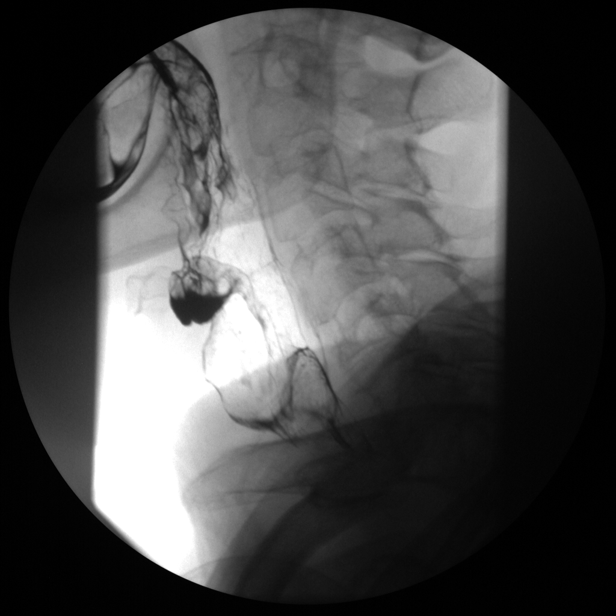

[Series 4: esophogr 4 · 2 of 2 frames shown]
[frame 1/2]
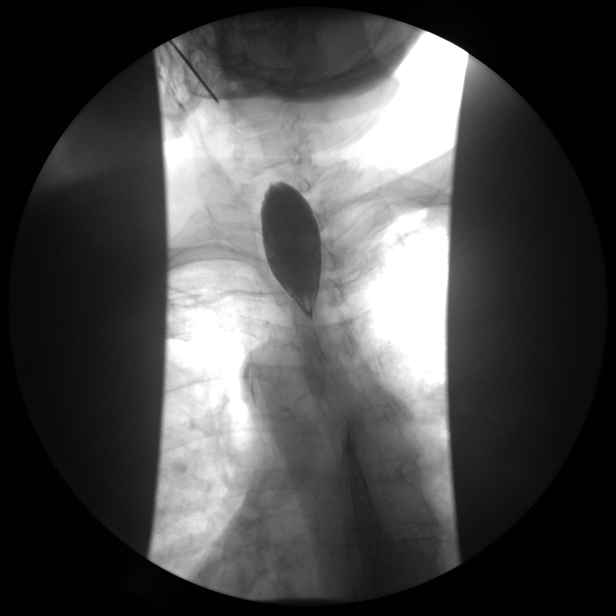
[frame 2/2]
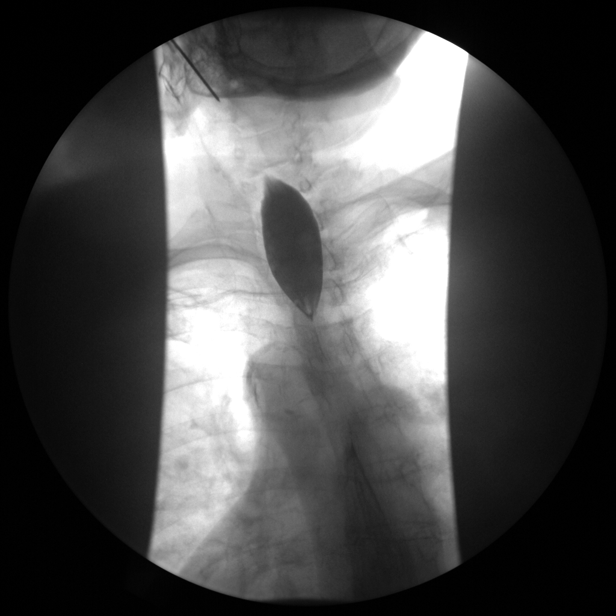

[Series 5: esophogr 5 · 2 of 2 slices shown]
[im 1/2]
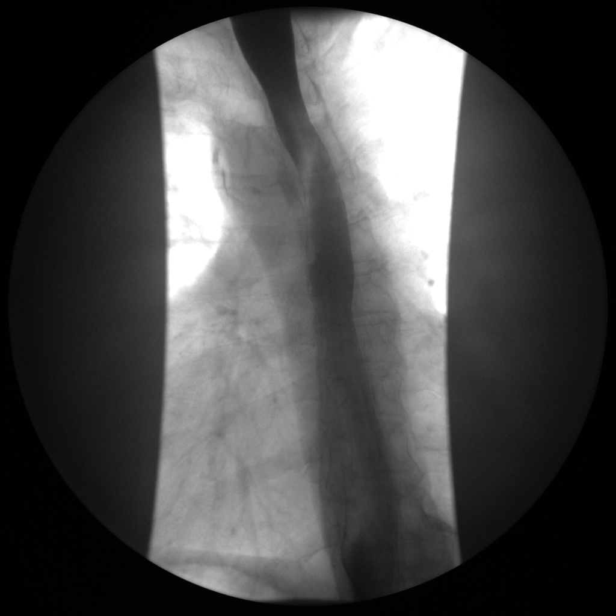
[im 2/2]
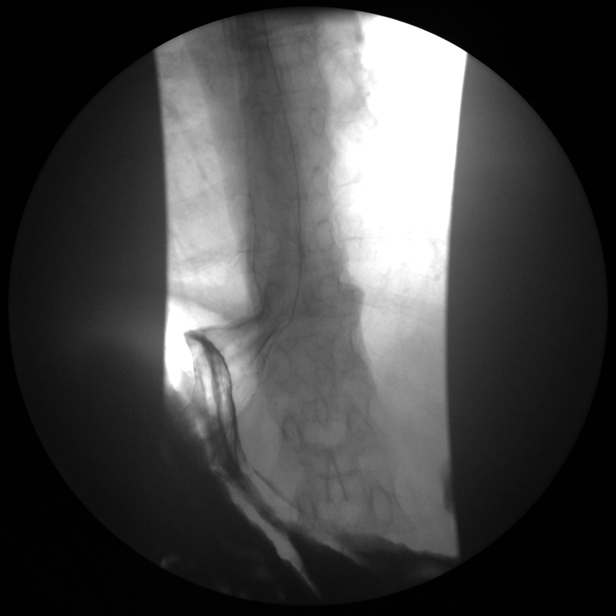

[13 of 13 positions shown; findings below may reference images not displayed]

DIAGNOSTIC STUDIES

EXAM

Air contrast esophagram.

INDICATION

Dysphagia
Patient states she mainly has problems when swallowing pills. They will feel like they are stuck in
throat. She states that it will take about 24
hours to get through stomach. Hx of Barrett's Esophogus as well. 19.04 mGy, Fluoro (..)

TECHNIQUE

Fluoro time was 1 minutes 30 seconds. Twelve spot films were obtained.

COMPARISONS

None available

FINDINGS

There is normal pharyngeal motility. No mucosal lesions are seen. Soft disc demonstrates normal
motility with small reducible hiatal hernia.

IMPRESSION

Small reducible hiatal hernia hiatal hernia otherwise unremarkable exam.

Tech Notes:

Patient states she mainly has problems when swallowing pills. They will feel like they are stuck in
throat. She states that it will take about 24 hours to get through stomach. Hx of Barrett's
Esophogus as well. 19.04 mGy, Fluoro Time 42 secs, 1.186 Gycm 2. CS

## 2023-07-03 IMAGING — US [ID]
1 series · 14 of 14 positions shown · non-contrast
Comparison: none

[Series 1: us guided fna, 1st lesion · 14 of 14 slices shown]
[im 1/14]
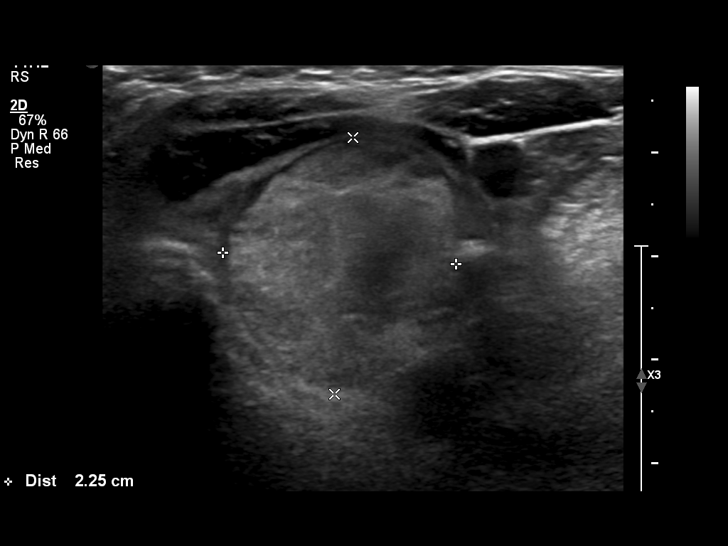
[im 2/14]
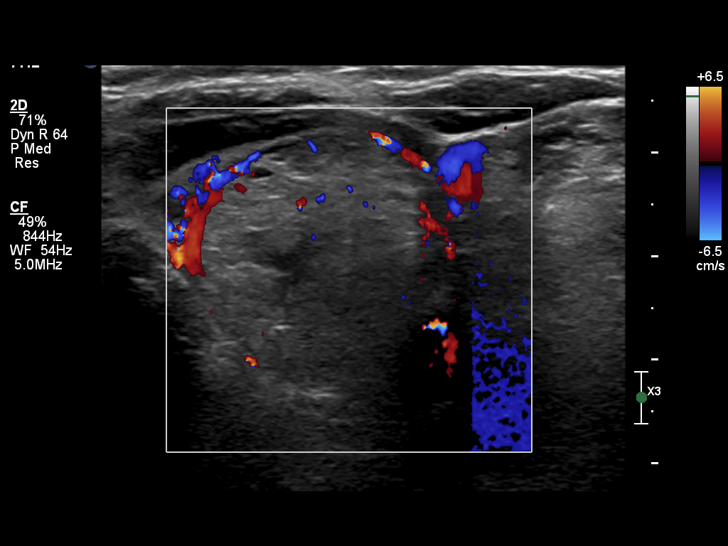
[im 3/14]
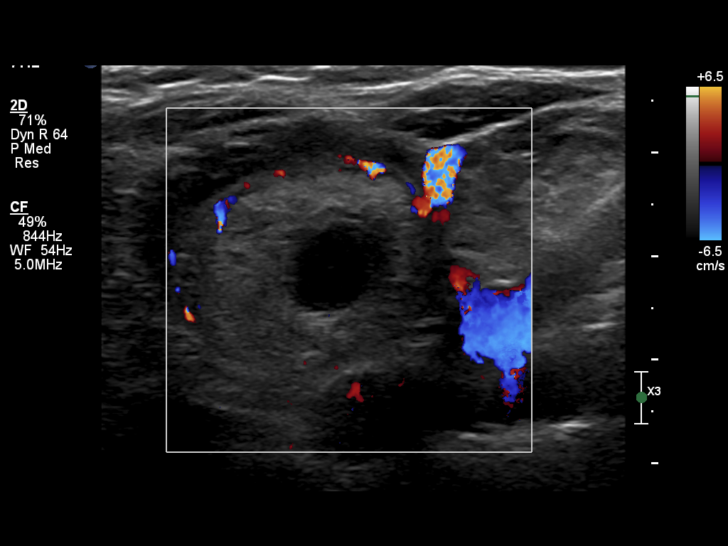
[im 4/14]
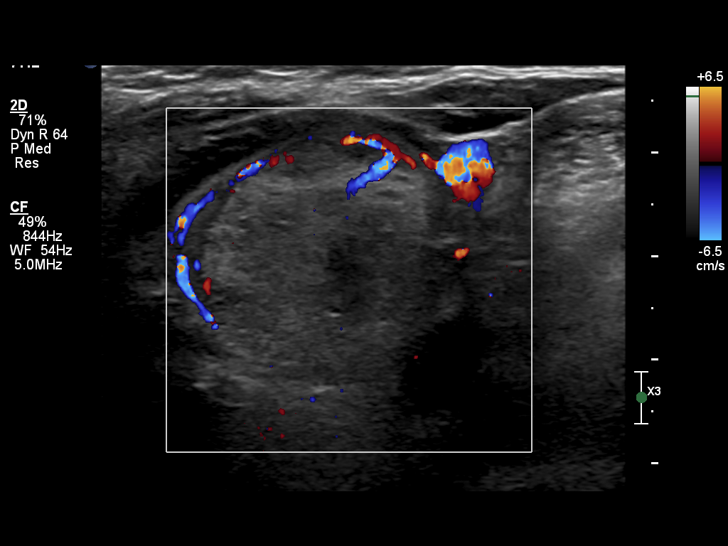
[im 5/14]
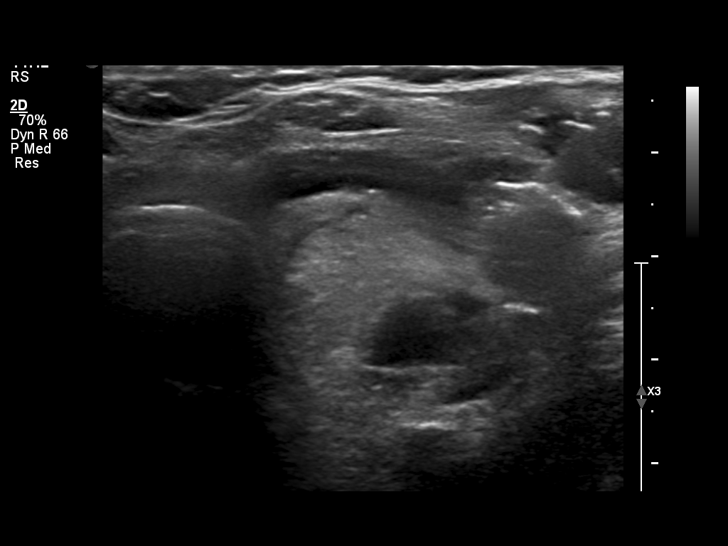
[im 6/14]
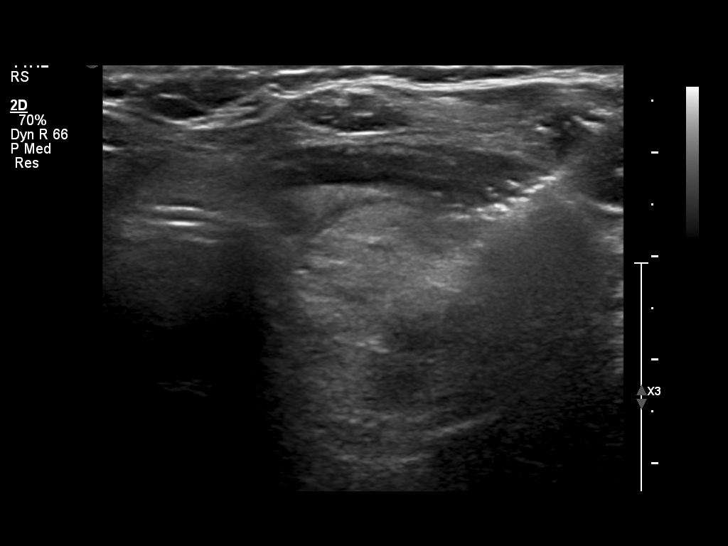
[im 7/14]
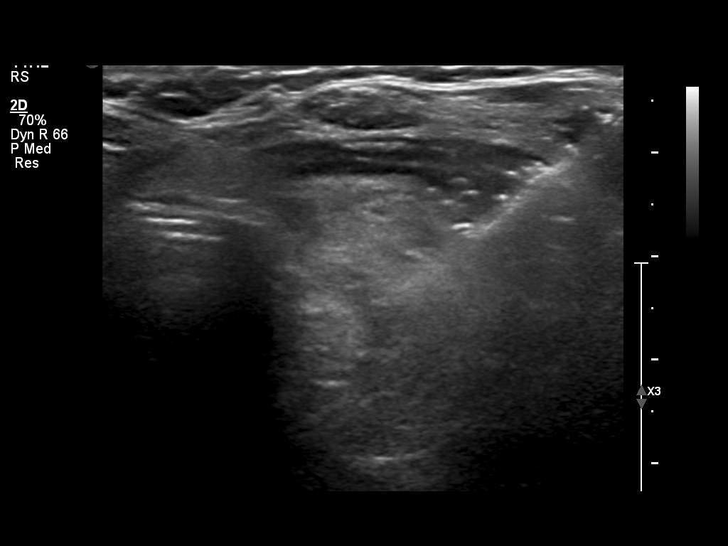
[im 8/14]
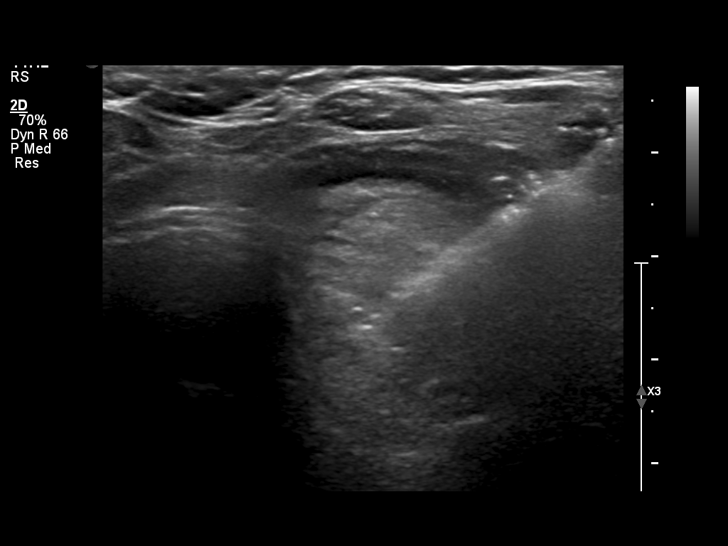
[im 9/14]
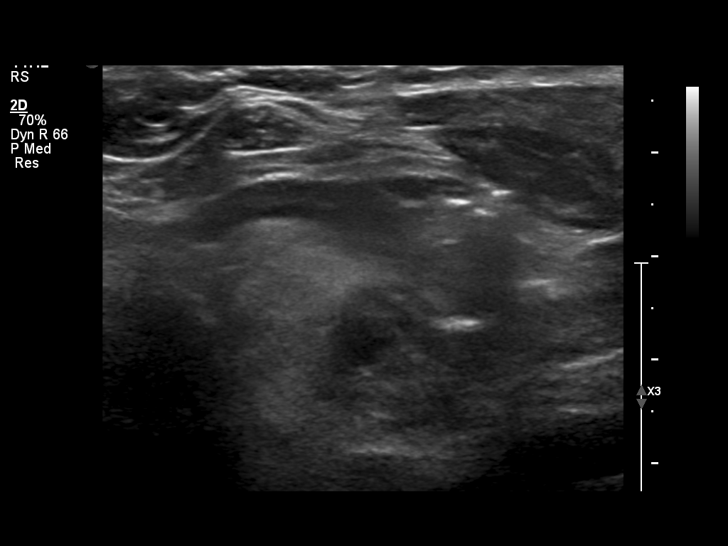
[im 10/14]
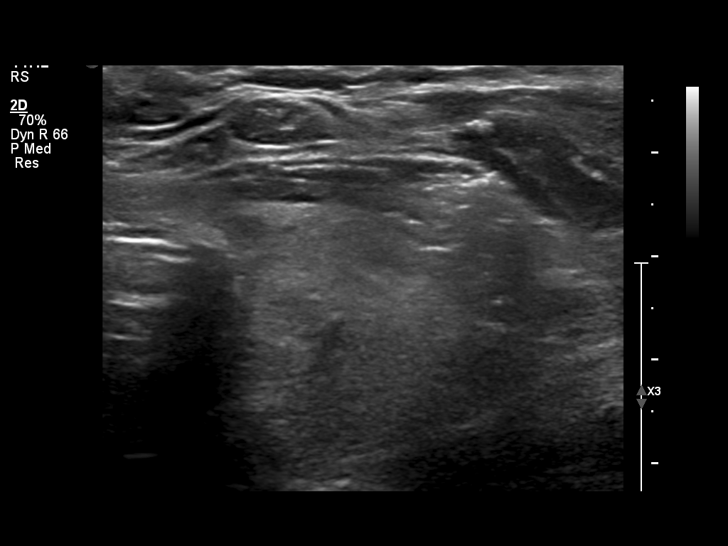
[im 11/14]
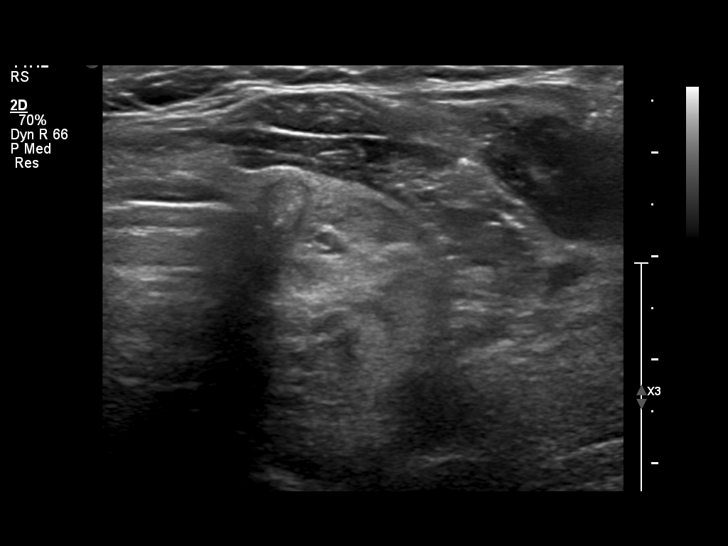
[im 12/14]
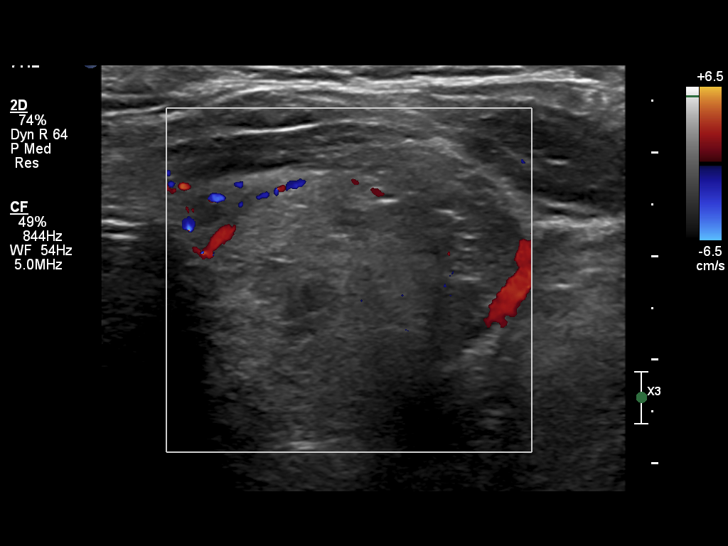
[im 13/14]
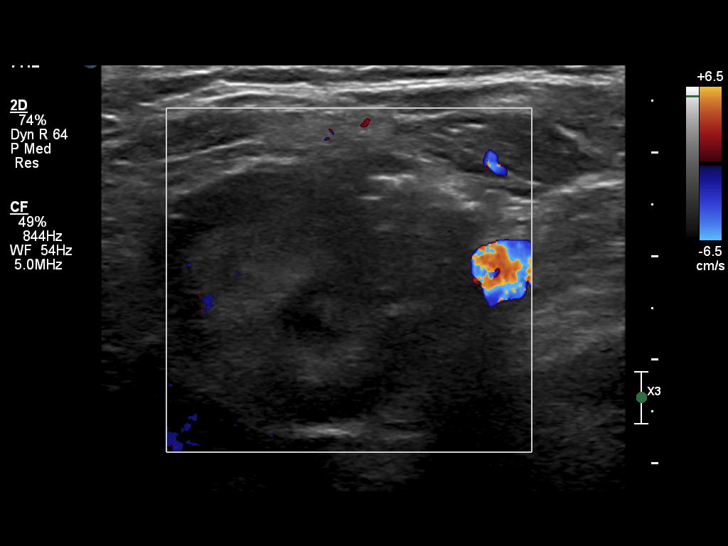
[im 14/14]
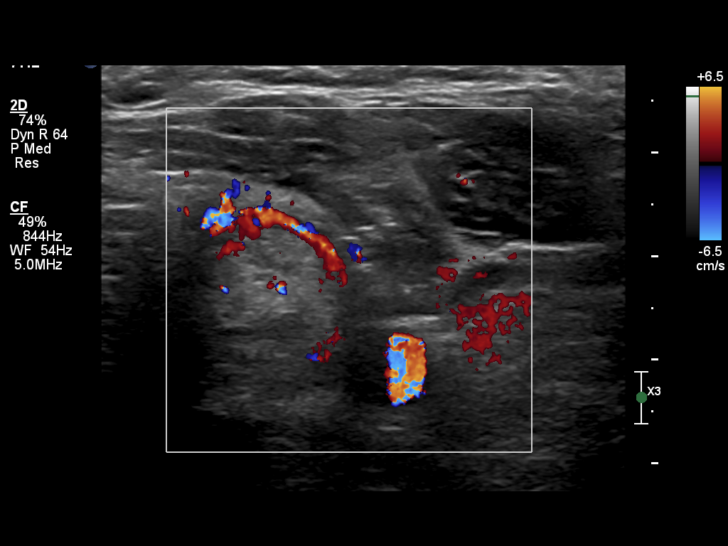

[14 of 14 positions shown; findings below may reference images not displayed]

DIAGNOSTIC STUDIES

EXAM

ULTRASONIC GUIDANCE FOR NEEDLE PLACEMENT, IMAGING SUPERVISION AND INTERPRETATION

INDICATION

Left thyroid nodule
abnormal ultrasound;

COMPARISONS

No prior studies are available for comparison.

TECHNIQUE AND PROCEDURE

The procedure, its risks and benefits were discussed with the patient. Immediately afterwards, the
patient was placed in the ultrasound stretcher in the supine position and following the selection of
an adequate approach the skin was marked and prepped and draped in the usual sterile fashion.
Subsequently, the soft tissues were infiltrated with 1% Xylocaine and immediately afterwards, a
small incision was made in the skin with a scalpel in the left thyroid lobe overlying the nodule and
utilizing an 17 gauge guide and a 18 gauge biopsy gun multiple samples of the nodule were obtained
and submitted for permanent section.

IMPRESSION

Successful ultrasound-guided core needle biopsy of the left thyroid nodule.

Tech Notes:

abnormal ultrasound;

## 2023-11-02 ENCOUNTER — Encounter: Admit: 2023-11-02 | Discharge: 2023-11-02 | Payer: MEDICARE

## 2023-11-05 ENCOUNTER — Encounter: Admit: 2023-11-05 | Discharge: 2023-11-05 | Payer: MEDICARE

## 2023-11-08 ENCOUNTER — Ambulatory Visit: Admit: 2023-11-08 | Discharge: 2023-11-08 | Payer: MEDICARE

## 2023-11-08 ENCOUNTER — Encounter: Admit: 2023-11-08 | Discharge: 2023-11-08 | Payer: MEDICARE

## 2023-11-08 VITALS — BP 129/73 | HR 74 | Ht 64.0 in | Wt 122.0 lb

## 2023-11-08 DIAGNOSIS — Z136 Encounter for screening for cardiovascular disorders: Principal | ICD-10-CM

## 2023-11-08 DIAGNOSIS — I5032 Chronic diastolic (congestive) heart failure: Secondary | ICD-10-CM

## 2023-11-08 NOTE — Progress Notes
 Date of Service: 11/08/2023    Haley [name] is a 78 y.o. female.       HPI   Ms. [name] has been followed for chronic obstructive pulmonary disease, hypertension and hypercholesterolemia.  The patient reports that she has completely stopped smoking cigarettes as of October 01, 2023.  She awoke with right-sided weakness on 12/29/2022.  She was hospitalized at an outside hospital.  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  The discharge note indicates that neurology was consulted and recommended dual antiplatelet therapy with aspirin  and Plavix for 30 days followed by aspirin  indefinitely.  An event monitor was obtained and the event monitor was signed on 01/30/2023.  It reported sinus rhythm, nonsustained ventricular tachycardia, and an episode of irregularly irregular wide-complex tachycardia possibly atrial fibrillation with aberrancy.  The patient was then contacted and placed on Xarelto  20 mg daily.  Her dose was reduced to 15 mg daily based upon her estimated creatinine clearance.  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  She reports no subjective recurrences of atrial fibrillation.  Ms. [name] reports that her blood pressure has been well controlled over the past 6 months.   Otherwise, the patient indicates that she has been stable and reports no angina, congestive symptoms, palpitations, sensation of sustained forceful heart pounding, lightheadedness or syncope.  She is currently walking for 45 minutes 3 days a week.  The patient reports no myalgias, claudication, bleeding abnormalities or recurrent symptoms suggestive of transient ischemic attack or stroke.SABRA She has been taking pravastatin  80 mg once a day and has been tolerating this medication without adverse effects.  Previously she has developed myalgias on other statin medications.  She was placed on Zetia  and did not tolerate this because she says it gave her myalgias.  Historically, [name] has recurrent episodes of bronchitis, and will occasionally have several episodes a year.  The patient reports that she underwent colonoscopy in the spring 2019 and that 10 polyps were found but no cancer.  She also underwent left hernia repair in April 2019 without complications. Ms. [name] reports no prior history of coronary artery disease, myocardial infarction, congestive heart failure, transient ischemic attack, stroke, or symptomatic peripheral arterial disease.  She did obtain an echo Doppler study on November 30, 2016 because of nocturnal hypoxia.  This suggested an echodensity with a fluid collection adjacent to the right atrium and led to a CT scan scan of the thorax.  The CT scan of the thorax showed a moderate amount of calcified plaque within the aortic arch as well as focal calcified plaque within the proximal left anterior descending coronary artery.  This led to obtaining a pharmacologic stress test which was low risk for coronary events.In April 2020 she developed an upper respiratory tract infection which improved with antibiotics and prednisone.  She was hospitalized for 3 days in March 2021 with diverticulitis and received antibiotics.  Afterwards she developed C. difficile required treatment with vancomycin.  She was treated for urinary tract infection with Cipro in June 2021. [name] reports having a mild case of COVID in July 2022.  She also fell back in April 2022 and reports receiving back injections.  he was hospitalized from April 14, 2021 until April 28, 2021 at [facility] health for rectal bleeding and abdominal discomfort.  Her chart indicates that colonoscopy was performed and a 6 mm polyp from the ascending colon was cauterized.  There was  mild mucosal inflammatory change involving the distal transverse colon suggestive of resolving ischemic colitis.  Her symptoms resolved and she was discharged home. Ms. [name] indicates that she was hospitalized on 06/30/2022 for left abdominal discomfort.  She was hospitalized for several days and believes that she was treated with antibiotics.  She believes that perhaps she had diverticulitis.  I notice that she was seen in outside emergency room on April 15, 2023 for abdominal discomfort that she believes was attributed to back disease.       Vitals:    11/08/23 1255   BP: 129/73   BP Source: Arm, Left Upper   Pulse: 74   SpO2: 96%   O2 Device: None (Room air)   PainSc: Zero   Weight: 55.3 kg (122 lb)   Height: 162.6 cm (5' 4)     Body mass index is 20.94 kg/m?SABRA     Past Medical History  Patient Active Problem List    Diagnosis Date Noted    Essential hypertension 01/12/2017     05/27/2018 ECHO DOPPLER:    Left Ventricle: Normal size and wall thickness. Concentric remodeling. Normal ejection fraction with LVEF=65%. No segmental wall motion abnormalities.  Right Ventricle: Normal size, wall thickness and ejection fraction.  Normal biatrial size.  There is no no significant valve disease.  Estimated Peak Systolic PA Pressure 27 mmHg  No pericardial effusion.  No prior for comparison.      DM2 (diabetes mellitus, type 2) (CMS-HCC) 10/30/2012     on metformin      Sinus problem 10/30/2012    HLD (hyperlipidemia) 10/30/2012    GERD (gastroesophageal reflux disease) 10/30/2012    Breast mass 10/25/2012     DIAGNOSIS:  Left breast mass on outside imaging     HISTORY:  Ms. Haley is a Caucasian female who presented to the [facility] on 10/30/2012 at age 75 for evaluation of a left breast mass on outside imaging.   BREAST IMAGING:  Mammogram:  Bilateral screening mammogram 06/12/12 [facility]) revealed a 6 mm mass in the lateral left breast. There was an oil cyst adjacent to the mass. No abnormalities in the right breast. 6 month follow up left mammogram was recommended. Left diagnostic mammogram 10/17/12 [facility]) revealed a 7 mm density in the lateral breast which had in size from May 2014. There was an oil cyst anterior to the lesion.  Ultrasound:  Left breast ultrasound 10/17/12 [facility]) revealed an oil cyst between 2-4:00. There was no correlation to mammogram finding.    REPRODUCTIVE HEALTH:  Age at first Menarche:  70  Age at First Live Birth: 6   Age at Menopause:  unknown, had hysterectomy, still has ovaries, unsure what year  Gravida:  3  Para: 3  Breastfeeding:  N/A    PERTINENT PMH:  Sinus trouble, DM2, hyperlipidemia, GERD, HTN  FAMILY HISTORY:  No family history of breast, ovarian, prostate or pancreatic cancer  PHYSICAL EXAM on PRESENTATION:  Right - No palpable breast masses. No skin, nipple, or areolar change. Left - No palpable breast masses. No skin, nipple, or areolar change. No supraclavicular or axillary adenopathy.  REFERRED BY:  Dr. Norleen Creeks             Review of Systems   Constitutional: Negative.   HENT: Negative.     Eyes: Negative.    Cardiovascular:  Positive for dyspnea on exertion and leg swelling.   Respiratory:  Positive for shortness of breath.    Endocrine:  Negative.    Hematologic/Lymphatic: Negative.    Skin: Negative.    Musculoskeletal: Negative.    Gastrointestinal: Negative.    Genitourinary: Negative.    Neurological: Negative.    Psychiatric/Behavioral: Negative.     Allergic/Immunologic: Negative.        Physical Exam  GENERAL: The patient is well developed, well nourished, resting comfortably and in no distress.   HEENT: No abnormalities of the visible oro-nasopharynx, conjunctiva or sclera are noted.  NECK: There is no jugular venous distension. Carotids are palpable and without bruits. There is no thyroid enlargement.  Chest: Lung fields are clear to auscultation. There are no wheezes or crackles.  CV: There is a regular rhythm. The first and second heart sounds are normal. There are no murmurs, gallops or rubs.  Her apical heart rate is 76 bpm.  ABD: The abdomen is soft and supple with normal bowel sounds. There is no hepatosplenomegaly, ascites, tenderness, masses or bruits.  Neuro: There are no focal motor defects.  Fine motor movements in the right hand look good.  No noticeable motor defects.  Ambulation is normal. Cognitive function appears normal.  Ext: Trace bipedal edema is noted without evidence of deep vein thrombosis. Peripheral pulses are satisfactory.    SKIN: There are no rashes and no cellulitis  PSYCH: The patient is calm, rationale and oriented.    Cardiovascular Studies  A twelve-lead ECG obtained on 11/08/2023 reveals normal sinus rhythm with a heart rate of 70 bpm.  Mild nondiagnostic ST-T wave abnormalities are seen.  An outside echocardiogram dated 12/29/2022 revealed: 1) findings consistent with concentric hypertrophy.  Left ventricular size is normal.  Normal wall motion.  Normal left ventricular systolic function.  Ejection fraction by Simpson's biplane = 59%.  Grade 1 diastolic dysfunction.  2) the right ventricle is normal in size.  3) left atrial volume and right atrial size are normal.  4) no Doppler evidence of hemodynamically significant valve dysfunction.  5) mildly calcified aortic valve with mild mitral valve regurgitation and no stenosis.  6) no significant pericardial effusion.     The outside event monitor from 01/30/2023 reported:    Sinus rhythm.     Nonsustained ventricular tachycardia.     Episode of irregularly irregular wide-complex tachycardia possibly   atrial fibrillation with aberrancy.     Patient monitored for 22d 6h 26m   48 events were transmitted. 0 patient triggered; 48 auto triggered   < 0.1% AF was found during the monitoring period VT occurred 1 time(s)   with fastest run 171 BPM PACs could not be calculated due to excessive   artifact 21,010 PVCs with PVC burden of 5%      Event monitor 02/13/2023:  Interpretation Summary  An event monitor was obtained to correlate symptoms with significant ectopy or arrhythmias.  The duration of the event monitor was 14 days and 0 hours with 8 days and 12 hours of analysis time.  The underlying rhythm was normal sinus rhythm with an average heart rate of 73 bpm.  The heart rate range was 53-118 bpm.  Infrequent isolated supraventricular ectopy was noted along with infrequent supraventricular ectopic pairs and triplets.  Occasional short runs of supraventricular tachycardia were noted with the fastest episode consisting of 10 beats with an average rate of 171 bpm which occurred at 11:37 AM on 02/27/2023.  The longest nonsustained supraventricular episode which occurred at 1:05 PM on 02/27/2023 lasted 20 seconds but had an average rate of only 92 bpm.  No sustained supraventricular tachycardia was noted.  Infrequent isolated ventricular ectopy was seen along with rare paired ventricular ectopy.  No ventricular triplets were noted but there were 3 short runs of nonsustained ventricular tachycardia.  The longest episode consisted of 9 beats with an average rate of heart rate of 135 bpm noted at 9:06 AM on 03/03/2023 and the fastest episode consisted of 4 beats with an average heart rate of 153 bpm noted at 1:16 AM on 02/24/2023.  Infrequent episodes of ventricular bigeminy were seen with the longest episode lasting 5.1 seconds.  Infrequent episodes of ventricular trigeminy were seen with the longest episode lasting 14.5 seconds.  No sustained ventricular tachycardia was noted.  There were no significant pauses and no episodes of second or third-degree AV block were recorded.  There were no episodes of atrial fibrillation or atrial flutter seen.  There were no diary entries and no triggered events.  Cardiovascular Health Factors  Vitals BP Readings from Last 3 Encounters:   11/08/23 129/73   05/01/23 112/68   02/13/23 132/67     Wt Readings from Last 3 Encounters:   11/08/23 55.3 kg (122 lb)   05/01/23 55.7 kg (122 lb 12.8 oz)   02/13/23 59.1 kg (130 lb 6.4 oz)     BMI Readings from Last 3 Encounters:   11/08/23 20.94 kg/m?   05/01/23 21.08 kg/m?   02/13/23 22.38 kg/m?      Smoking Tobacco Use History[1] Lipid Profile Cholesterol   Date Value Ref Range Status   12/30/2022 125  Final     HDL   Date Value Ref Range Status   12/30/2022 36 (L) >=50 Final     LDL   Date Value Ref Range Status   12/30/2022 62  Final     Triglycerides   Date Value Ref Range Status   12/30/2022 97  Final      Blood Sugar Hemoglobin A1C   Date Value Ref Range Status   07/06/2022 6.0  Final     Glucose   Date Value Ref Range Status   08/22/2023 104  Final   04/15/2023 189 (H) 74 - 106 Final   07/03/2022 86  Final          Problems Addressed Today  Encounter Diagnoses   Name Primary?    Screening for heart disease Yes       Assessment and Plan     I congratulated Ms. Haley Blush on complete cessation from cigarette smoking.  Her CHADS2 Vasc score appears to be 8 for age (2 points), prior stroke (2 points) gender, hypertension, diabetes mellitus, and peripheral arterial disease.  The appropriate dose of rivaroxaban  for stroke prevention in atrial fibrillation for a patient of her age, weight and serum creatinine would be 15 mg daily.   The risks and benefits of anticoagulation therapy have been reviewed with the patient. The patient understands that anticoagulation is used to decrease thrombotic or clotting complications associated with atrial fibrillation/flutter, such as stroke and systemic embolization which can be disabling or fatal, but can, on occasion, lead to life-threatening bleeding complications including gastrointestinal and intracranial hemorrhage. The patient wishes to continue anticoagulation with rivaroxaban  at the present time.  I have asked the patient to keep a log book of her BP readings and to report BP readings exceeding 130/80 mm Hg. I have asked her to return for follow-up in 6 months time.  The total time spent during this interview and exam with preparation and chart review  was 30 minutes.          Current Medications (including today's revisions)   acetaminophen (TYLENOL) 500 mg tablet Take one tablet by mouth every 4 hours as needed for Pain. Max of 4,000 mg of acetaminophen in 24 hours.    albuterol  (PROAIR  HFA, VENTOLIN  HFA, OR PROVENTIL  HFA) 90 mcg/actuation inhaler Inhale two puffs by mouth into the lungs four times daily as needed for Wheezing or Shortness of Breath. Shake well before use.    amLODIPine (NORVASC) 10 mg tablet Take one tablet by mouth daily.    calcium carbonate (TUMS PO) Take 2 tablets by mouth as Needed.    CHOLEcalciferoL (vitamin D3) (VITAMIN D3) 50 mcg (2,000 unit) tablet Take one tablet by mouth daily.    cyanocobalamin (vitamin B-12) (RUBRAMIN PC) 1,000 mcg/mL injection solution Inject 1 mL into the muscle every 30 days.    dicyclomine (BENTYL) 20 mg tablet Take one tablet by mouth daily.    escitalopram oxalate (LEXAPRO) 20 mg tablet Take one tablet by mouth daily.    fexofenadine (ALLEGRA) 180 mg tablet Take one tablet by mouth as Needed.    fluticasone (FLONASE) 50 mcg/actuation nasal spray Apply two sprays to each nostril as directed as Needed.    L.acid/L.casei/B.bif/B.lon/FOS (PROBIOTIC BLEND PO) Take 1 capsule by mouth daily.    losartan(+) (COZAAR) 100 mg tablet Take one tablet by mouth daily.    omeprazole DR (PRILOSEC) 40 mg capsule Take one capsule by mouth twice daily.    potassium chloride SR (K-DUR) 20 mEq tablet Take one tablet by mouth daily. Patient takes 1 tablet daily and takes 2 tablets daily on days she takes Torsemide    pravastatin  (PRAVACHOL ) 80 mg tablet TAKE 1 TABLET BY MOUTH ONCE DAILY AT BEDTIME    rivaroxaban  (XARELTO ) 15 mg tablet Take one tablet by mouth daily with dinner.    sennosides/docusate sodium (SENNA PLUS PO) Take 2 tablets by mouth at bedtime daily.    sucralfate (CARAFATE) 1 gram tablet Take one tablet by mouth as Needed. Take on an empty stomach.    SYMBICORT 160-4.5 mcg/actuation inhalation Inhale two puffs by mouth into the lungs twice daily as needed.    torsemide (DEMADEX) 20 mg tablet Take one tablet by mouth as Needed.    zolpidem (AMBIEN) 10 mg tablet Take one tablet by mouth at bedtime as needed.                 [1]   Social History  Tobacco Use   Smoking Status Former    Current packs/day: 0.00    Average packs/day: 1 pack/day for 15.0 years (15.0 ttl pk-yrs)    Types: Cigarettes    Start date: 10/30/2001    Quit date: 10/30/2016    Years since quitting: 7.0   Smokeless Tobacco Never

## 2023-11-08 NOTE — Patient Instructions
Thank you for visiting our office today.    We would like to make the following medication adjustments:  NONE       Otherwise continue the same medications as you have been doing.          We will be pursuing the following tests after your appointment today:       Orders Placed This Encounter    ECG 12-LEAD         We will plan to see you back in 6 months.  Please call us in the meantime with any questions or concerns.        Please allow 5-7 business days for our providers to review your results. All normal results will go to MyChart. If you do not have Mychart, it is strongly recommended to get this so you can easily view all your results. If you do not have mychart, we will attempt to call you once with normal lab and testing results. If we cannot reach you by phone with normal results, we will send you a letter.  If you have not heard the results of your testing after one week please give us a call.       Your Cardiovascular Medicine [place] Team (Steve, Lisa, Jamie, Melanie, and Gael Delude)  phone number is [phone].

## 2023-12-20 ENCOUNTER — Encounter: Admit: 2023-12-20 | Discharge: 2023-12-20 | Payer: MEDICARE

## 2023-12-20 DIAGNOSIS — I10 Essential (primary) hypertension: Secondary | ICD-10-CM

## 2023-12-20 DIAGNOSIS — R0609 Other forms of dyspnea: Secondary | ICD-10-CM

## 2023-12-20 DIAGNOSIS — E7849 Other hyperlipidemia: Principal | ICD-10-CM

## 2023-12-20 DIAGNOSIS — I5032 Chronic diastolic (congestive) heart failure: Secondary | ICD-10-CM

## 2023-12-20 NOTE — Telephone Encounter [36]
 Debroah Elspeth NOVAK, MD to Me  (Selected Message)  12/20/23  4:15 PM  Larraine: Most likely her symptoms are related to progressive COPD.  She has a longstanding history of cigarette smoking and my note from 11/08/2023 indicated that she had just stopped smoking on 10/01/2023.  Please order an echo Doppler study as well as a regadenoson  thallium stress test just in case her dyspnea is multifactorial.  I noticed that she has a clinic appointment to see me on 06/24/2024.  No need to move that up unless there are significant abnormalities on her echo Doppler study or regadenoson  thallium stress test.  Thanks.  SBG        Recommendations called to patient. Patient verbalized understanding and verified D-T-L of upcoming appointments.

## 2023-12-20 NOTE — Telephone Encounter [36]
 Patient called nursing line stating that she was in to see her pulmonologist, Dr. Quenten due to experiencing increasing shortness of breath mainly during times of exertion (walking up stairs). She states that this is new for her. She states that she has always had lung problems but the shortness of breath has gotten worse over the past couple of weeks. She denies any other symptoms including chest pain, palpitations, dizziness, swelling in her extremities, etc. She states that her blood pressures are averaging 120s/80s with HR in the 90s. She states that her pulse oximeter at home always registers her oxygen around 95%.     Tiesha states that Dr. Quenten completed a CT chest and that it did show quite a bit of emphysema on top her known COPD and Asthma. She states that Dr. Quenten ordered repeat PFTs which she completed Tuesday. Results are pending. She states that Dr. Quenten also mentioned that the CT chest revealed quite a bit of coronary calcifications and he told her that the shortness of breath could be an anginal equivalent and he wanted her to get back in with Dr. Debroah to discuss possible testing.     Educated patient about worrisome signs and symptoms and when to report to the ER. Patient verbalized understanding and has no further     Last stress test was a regadenoson  MPI in 2020- normal, no evidence of ischemia  Last echo 2020- no wall motion abnormalities, EF 65%        Will route to Dr. Debroah for review and recommendations

## 2023-12-20 NOTE — Patient Instructions [37]
 [facility]    Nuclear Stress Test Instructions      Your cardiologist has asked that you have a nuclear stress test (also known as a Myocardial Perfusion Imaging (MPI) test.    This evaluation of your heart muscle consists of two sets of nuclear images and either a Treadmill stress test or a chemical stress test, decided by you and your physician.    You will get an IV placed in your arm for the test.    You will need to be able to raise your arm up by your head for about 20 minutes and lie on your back for about 10 minutes.  Please discuss this with your doctor or talk with the nuclear technologist or nurse if these are a problem for you.    It is recommended not to schedule any other appointment for the same day.      Wear comfortable clothing and walking shoes if you are walking on the treadmill.  Women should wear shorts or comfortable pants instead of dresses.   Sweatshirts or T-shirts work really well for imaging.    If you have a Zio Patch cardiac monitor in place, please reschedule your nuclear stress test after your monitoring period is complete. If you would like to proceed with the nuclear stress test during the monitoring time, the patch will have to be removed and the data will be lost; we can place a new patch following testing.       If you have a cardiac monitor with replacement patches, please inform the nurse prior to your appointment.  The monitor will need to be removed during testing. Please bring replacement patches with you so that we can resume monitoring following your test.     There may be enough time to leave to get a snack after the stress portion of the test.   The Technologist will tell you what time to return for the second set of images.    PLEASE NO CAFFEINE 24 HOURS PRIOR TO TEST:  Examples include coffee, tea, decaffeinated drinks, colas, [facility], Dr. Reino Kent.  Some orange sodas and root beers have caffeine, please check.  No energy drinks, Excedrin, Midol, or any foods CONTAINING CHOCOLATE.   Consuming Caffeine may postpone your test.    PLEASE DO NOT EAT OR DRINK THE MORNING OF YOUR TEST.  Water is ok to drink with your morning medications.    Please hold these medications the day of test:      DIABETIC PATIENTS:  if insulin dependent:  please take one third of your insulin with two pieces of dry toast and a small juice.  Bring remaining 2/3   insulin and oral diabetic medications with you to your test.    PLEASE NO TOBACCO PRODUCTS BETWEEN SCANS  You will NOT need a driver for this test.  But are welcome to bring a visitor with you.   Visitors will not be able to accompany you back to stress room.   Please do not bring children to the nuclear stress test.    TEST FINDINGS:   You will receive the results of the test within 7 business days of completion of this test by telephone. If you have any questions concerning your nuclear stress test or if you do not hear from your cardiologist/or nurse with 7 business days, please call our office.

## 2023-12-24 ENCOUNTER — Encounter: Admit: 2023-12-24 | Discharge: 2023-12-24 | Payer: MEDICARE

## 2023-12-24 ENCOUNTER — Ambulatory Visit: Admit: 2023-12-24 | Discharge: 2023-12-24 | Payer: MEDICARE

## 2023-12-25 ENCOUNTER — Encounter: Admit: 2023-12-25 | Discharge: 2023-12-25 | Payer: MEDICARE

## 2023-12-25 NOTE — Telephone Encounter [36]
-----   Message from GORMAN Alexander, MD sent at 12/25/2023  4:27 PM CST -----  Larraine armin Kemps: Favorable stress test.  Please let her know.  Also please forward to [facility] if okay with Ms. Rea Blush.  Thanks.  SBG  ----- Message -----  From: Pierrette Emeline LABOR, MD  Sent: 12/25/2023   4:26 PM CST  To: Elspeth KATHEE Alexander, MD

## 2024-01-11 ENCOUNTER — Encounter: Admit: 2024-01-11 | Discharge: 2024-01-11 | Payer: MEDICARE

## 2024-01-11 ENCOUNTER — Ambulatory Visit: Admit: 2024-01-11 | Discharge: 2024-01-11 | Payer: MEDICARE
